# Patient Record
Sex: Female | Born: 1975 | Race: White | Hispanic: No | Marital: Married | Smoking: Never smoker
Health system: Southern US, Community
[De-identification: ages and names within clinical notes are randomized; demographics above are authoritative.]

## PROBLEM LIST (undated history)

## (undated) ENCOUNTER — Emergency Department (HOSPITAL_COMMUNITY): Discharge status: Against Medical Advice | Payer: BC Managed Care – PPO

## (undated) DIAGNOSIS — R5381 Other malaise: Secondary | ICD-10-CM

## (undated) DIAGNOSIS — B977 Papillomavirus as the cause of diseases classified elsewhere: Secondary | ICD-10-CM

## (undated) DIAGNOSIS — B2790 Infectious mononucleosis, unspecified without complication: Secondary | ICD-10-CM

## (undated) DIAGNOSIS — F509 Eating disorder, unspecified: Secondary | ICD-10-CM

## (undated) DIAGNOSIS — E785 Hyperlipidemia, unspecified: Secondary | ICD-10-CM

## (undated) DIAGNOSIS — F32A Depression, unspecified: Secondary | ICD-10-CM

## (undated) DIAGNOSIS — F329 Major depressive disorder, single episode, unspecified: Secondary | ICD-10-CM

## (undated) DIAGNOSIS — R5383 Other fatigue: Secondary | ICD-10-CM

## (undated) DIAGNOSIS — S83419A Sprain of medial collateral ligament of unspecified knee, initial encounter: Secondary | ICD-10-CM

## (undated) DIAGNOSIS — E039 Hypothyroidism, unspecified: Secondary | ICD-10-CM

## (undated) HISTORY — DX: Papillomavirus as the cause of diseases classified elsewhere: B97.7

## (undated) HISTORY — DX: Major depressive disorder, single episode, unspecified: F32.9

## (undated) HISTORY — DX: Eating disorder, unspecified: F50.9

## (undated) HISTORY — DX: Other fatigue: R53.81

## (undated) HISTORY — DX: Depression, unspecified: F32.A

## (undated) HISTORY — DX: Sprain of medial collateral ligament of unspecified knee, initial encounter: S83.419A

## (undated) HISTORY — DX: Hyperlipidemia, unspecified: E78.5

## (undated) HISTORY — DX: Other fatigue: R53.83

## (undated) HISTORY — DX: Hypothyroidism, unspecified: E03.9

## (undated) HISTORY — DX: Infectious mononucleosis, unspecified without complication: B27.90

---

## 1994-12-22 DIAGNOSIS — B2790 Infectious mononucleosis, unspecified without complication: Secondary | ICD-10-CM

## 1994-12-22 HISTORY — DX: Infectious mononucleosis, unspecified without complication: B27.90

## 1999-12-19 ENCOUNTER — Other Ambulatory Visit: Admission: RE | Admit: 1999-12-19 | Discharge: 1999-12-19 | Payer: Self-pay | Admitting: Family Medicine

## 2000-09-12 ENCOUNTER — Emergency Department (HOSPITAL_COMMUNITY): Admission: EM | Admit: 2000-09-12 | Discharge: 2000-09-12 | Payer: Self-pay | Admitting: Emergency Medicine

## 2000-09-12 ENCOUNTER — Encounter: Payer: Self-pay | Admitting: Emergency Medicine

## 2000-12-22 DIAGNOSIS — S83419A Sprain of medial collateral ligament of unspecified knee, initial encounter: Secondary | ICD-10-CM

## 2000-12-22 HISTORY — PX: KNEE SURGERY: SHX244

## 2000-12-22 HISTORY — DX: Sprain of medial collateral ligament of unspecified knee, initial encounter: S83.419A

## 2000-12-25 ENCOUNTER — Other Ambulatory Visit: Admission: RE | Admit: 2000-12-25 | Discharge: 2000-12-25 | Payer: Self-pay | Admitting: Family Medicine

## 2000-12-31 ENCOUNTER — Ambulatory Visit (HOSPITAL_BASED_OUTPATIENT_CLINIC_OR_DEPARTMENT_OTHER): Admission: RE | Admit: 2000-12-31 | Discharge: 2001-01-01 | Payer: Self-pay | Admitting: Orthopedic Surgery

## 2001-03-25 ENCOUNTER — Encounter: Payer: Self-pay | Admitting: Family Medicine

## 2001-03-25 ENCOUNTER — Other Ambulatory Visit: Admission: RE | Admit: 2001-03-25 | Discharge: 2001-03-25 | Payer: Self-pay | Admitting: Family Medicine

## 2001-03-25 LAB — CONVERTED CEMR LAB: Pap Smear: ABNORMAL

## 2001-04-19 ENCOUNTER — Other Ambulatory Visit: Admission: RE | Admit: 2001-04-19 | Discharge: 2001-04-19 | Payer: Self-pay | Admitting: *Deleted

## 2001-04-19 ENCOUNTER — Encounter (INDEPENDENT_AMBULATORY_CARE_PROVIDER_SITE_OTHER): Payer: Self-pay

## 2001-04-21 HISTORY — PX: COLPOSCOPY: SHX161

## 2003-09-22 HISTORY — PX: CHOLECYSTECTOMY: SHX55

## 2003-10-12 ENCOUNTER — Emergency Department (HOSPITAL_COMMUNITY): Admission: AD | Admit: 2003-10-12 | Discharge: 2003-10-12 | Payer: Self-pay | Admitting: Emergency Medicine

## 2003-10-12 ENCOUNTER — Encounter: Payer: Self-pay | Admitting: Emergency Medicine

## 2003-10-14 ENCOUNTER — Observation Stay (HOSPITAL_COMMUNITY): Admission: RE | Admit: 2003-10-14 | Discharge: 2003-10-15 | Payer: Self-pay

## 2003-10-14 ENCOUNTER — Encounter (INDEPENDENT_AMBULATORY_CARE_PROVIDER_SITE_OTHER): Payer: Self-pay | Admitting: Specialist

## 2005-03-20 ENCOUNTER — Ambulatory Visit: Payer: Self-pay | Admitting: Family Medicine

## 2005-12-01 ENCOUNTER — Ambulatory Visit: Payer: Self-pay | Admitting: Family Medicine

## 2005-12-24 ENCOUNTER — Inpatient Hospital Stay: Payer: Self-pay | Admitting: Obstetrics & Gynecology

## 2006-02-24 ENCOUNTER — Ambulatory Visit: Payer: Self-pay | Admitting: Family Medicine

## 2006-05-15 ENCOUNTER — Observation Stay: Payer: Self-pay | Admitting: Obstetrics and Gynecology

## 2006-06-25 ENCOUNTER — Observation Stay: Payer: Self-pay | Admitting: Obstetrics and Gynecology

## 2006-07-06 ENCOUNTER — Inpatient Hospital Stay: Payer: Self-pay | Admitting: Obstetrics & Gynecology

## 2007-07-14 ENCOUNTER — Encounter: Payer: Self-pay | Admitting: Family Medicine

## 2007-07-14 DIAGNOSIS — E039 Hypothyroidism, unspecified: Secondary | ICD-10-CM | POA: Insufficient documentation

## 2007-07-14 DIAGNOSIS — F509 Eating disorder, unspecified: Secondary | ICD-10-CM | POA: Insufficient documentation

## 2007-07-14 DIAGNOSIS — F329 Major depressive disorder, single episode, unspecified: Secondary | ICD-10-CM | POA: Insufficient documentation

## 2007-07-16 ENCOUNTER — Ambulatory Visit: Payer: Self-pay | Admitting: Family Medicine

## 2007-07-16 DIAGNOSIS — M25561 Pain in right knee: Secondary | ICD-10-CM

## 2007-07-16 DIAGNOSIS — G8929 Other chronic pain: Secondary | ICD-10-CM | POA: Insufficient documentation

## 2007-07-16 DIAGNOSIS — L259 Unspecified contact dermatitis, unspecified cause: Secondary | ICD-10-CM | POA: Insufficient documentation

## 2007-07-16 DIAGNOSIS — M25562 Pain in left knee: Secondary | ICD-10-CM

## 2007-07-16 DIAGNOSIS — R5383 Other fatigue: Secondary | ICD-10-CM

## 2007-07-16 DIAGNOSIS — R5381 Other malaise: Secondary | ICD-10-CM | POA: Insufficient documentation

## 2007-07-19 LAB — CONVERTED CEMR LAB
ALT: 16 units/L (ref 0–35)
AST: 13 units/L (ref 0–37)
Albumin: 3.7 g/dL (ref 3.5–5.2)
Alkaline Phosphatase: 64 units/L (ref 39–117)
BUN: 7 mg/dL (ref 6–23)
Basophils Absolute: 0.1 10*3/uL (ref 0.0–0.1)
Basophils Relative: 1.2 % — ABNORMAL HIGH (ref 0.0–1.0)
Bilirubin, Direct: 0.1 mg/dL (ref 0.0–0.3)
CO2: 28 meq/L (ref 19–32)
Calcium: 9.2 mg/dL (ref 8.4–10.5)
Chloride: 106 meq/L (ref 96–112)
Creatinine, Ser: 0.5 mg/dL (ref 0.4–1.2)
Eosinophils Absolute: 0.2 10*3/uL (ref 0.0–0.6)
Eosinophils Relative: 2 % (ref 0.0–5.0)
Free T4: 0.8 ng/dL (ref 0.6–1.6)
GFR calc Af Amer: 184 mL/min
GFR calc non Af Amer: 152 mL/min
Glucose, Bld: 78 mg/dL (ref 70–99)
HCT: 39.1 % (ref 36.0–46.0)
Hemoglobin: 13.6 g/dL (ref 12.0–15.0)
Lymphocytes Relative: 27 % (ref 12.0–46.0)
MCHC: 34.8 g/dL (ref 30.0–36.0)
MCV: 92.8 fL (ref 78.0–100.0)
Monocytes Absolute: 1 10*3/uL — ABNORMAL HIGH (ref 0.2–0.7)
Monocytes Relative: 13.4 % — ABNORMAL HIGH (ref 3.0–11.0)
Neutro Abs: 4.4 10*3/uL (ref 1.4–7.7)
Neutrophils Relative %: 56.4 % (ref 43.0–77.0)
Platelets: 323 10*3/uL (ref 150–400)
Potassium: 4.2 meq/L (ref 3.5–5.1)
RBC: 4.22 M/uL (ref 3.87–5.11)
RDW: 11.7 % (ref 11.5–14.6)
Sodium: 138 meq/L (ref 135–145)
T3 Uptake Ratio: 37.4 % — ABNORMAL HIGH (ref 22.5–37.0)
TSH: 1.1 microintl units/mL (ref 0.35–5.50)
Total Bilirubin: 0.8 mg/dL (ref 0.3–1.2)
Total Protein: 6.8 g/dL (ref 6.0–8.3)
WBC: 7.8 10*3/uL (ref 4.5–10.5)

## 2007-08-02 ENCOUNTER — Ambulatory Visit: Payer: Self-pay | Admitting: Family Medicine

## 2007-10-27 ENCOUNTER — Telehealth: Payer: Self-pay | Admitting: Family Medicine

## 2007-11-20 DIAGNOSIS — J029 Acute pharyngitis, unspecified: Secondary | ICD-10-CM | POA: Insufficient documentation

## 2007-11-22 ENCOUNTER — Ambulatory Visit: Payer: Self-pay | Admitting: Internal Medicine

## 2007-11-22 LAB — CONVERTED CEMR LAB: Rapid Strep: NEGATIVE

## 2009-01-04 ENCOUNTER — Ambulatory Visit: Payer: Self-pay | Admitting: Internal Medicine

## 2011-05-09 NOTE — Op Note (Signed)
[place]  Patient:    Teresa Horne, Teresa Horne                     MRN: [id number] Proc. Date: 12/31/00 Adm. Date:  [id number] Disc. Date: [id number] Attending:  Benny Lennert                           Operative Report  PREOPERATIVE DIAGNOSES:  Patellofemoral instability, left knee, with multiple recurrent patellofemoral dislocations, now with lateral tracking and tethering patellofemoral joint.  Resultant chondromalacia of the patella.  POSTOPERATIVE DIAGNOSES:  Patellofemoral instability, left knee, with multiple recurrent patellofemoral dislocations, now with lateral tracking and tethering patellofemoral joint.  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  PROCEDURES:  Left knee examination under anesthesia and arthroscopy, with chondroplasty of the patella.  Partial medial meniscectomy.  Arthroscopic lateral retinacular release.  Open medial reefing patellofemoral joint at medial retinaculum.  SURGEON:  Loreta Ave, M.D.  ASSISTANT:  Arlys John D. Petrarca, P.A.-C.  ANESTHESIA:  General.  ESTIMATED BLOOD LOSS:  Minimal.  TOURNIQUET TIME:  One hour.  SPECIMENS:  None.  CULTURES:  None.  COMPLICATIONS:  None.  DRESSINGS:  Soft compressive with knee immobilizer.  DESCRIPTION OF PROCEDURE:  Patient brought to the operating room and placed on the operating table in supine position.  After adequate anesthesia had been obtained, both knees examined.  Full motion, good stability to ligamentous testing, both knees.  Obvious patellofemoral instability, left knee, where the patella could be dislocated without much difficulty.  Lateral tethering, much more so on the left than on the right knee.  Tourniquet applied, prepped and draped usual sterile fashion.  Exsanguinated with elevation and Esmarch. Tourniquet inflated to 300 mmHg.  Two portals created, one each medial and lateral  parapatellar.  Inflow catheter introduced along with the arthroscope and the knee distended.  It was inspected.  A pattern of lateral tracking and lateral instability and lateral tethering confirmed with grade 2 and 3 chondral changes, most of this fissuring.  Debrided to a stable surface. Trochlea relatively shallow, but no chondral changes.  Small radial tear midportion lateral meniscus, saucerized out.  Remaining articular cartilage, meniscus, cruciate ligaments intact.  Thoroughly assessed patellofemoral tracking from all angles.  Lateral release performed with cautery from the vastus lateralis attachment superior down to the lateral joint line, which markedly improved tethering but still left her with marked laxity of the medial retinaculum.  Instruments and fluid removed.  Small curved incision along the medial patella.  Skin and subcutaneous tissue divided.  Superficial fascia was lifted off the top of the patella and over the medial retinaculum and VMO for later repair.  I then took down the vastus medialis and medial retinaculum from just above the patella to just below the patella and along the medial side.  This was well-captured with nonabsorbable #2 Ethibond, which was then utilized to reef the entire medial structures a centimeter up on top of the patella.  This was then oversewn with the superficial fascia.  Deep sutures nonabsorbable, superficial sutures were absorbable.  The knee was then assessed.  The knee was then assessed with marked improvement of medial stability; however, she tilted up too much on the lateral side with clinical assessment and with arthroscopy.  I therefore was able to bring the medial incision over toward the lateral side of the patella to assess our  lateral release.  Although done in the standard fashion, this did appear to extend up into the vastus lateralis tendon slightly.  As this allowed too much lateral elevation of the patella, I put in a  single nonabsorbable suture and one absorbable suture in the superior end of the release, which is the inferior part of the vastus lateralis tendon.  This was approximated anatomically.  The knee was then reassessed with excellent alignment and stability, with a good lateral release, which was now not excessive, and with good medial reefing.  I could bring it to more than 100 degrees of flexion without undue tension on the repair.  All wounds were copiously irrigated and closed with subcutaneous and subcuticular Vicryl.  Portals closed with nylon.  Margins of the wound injected with Marcaine, as was the knee.  Sterile compressive dressing applied.  Tourniquet deflated and removed.  Knee immobilizer applied. Anesthesia was then to proceed with a femoral nerve block.  Following this, anesthesia reversed, brought to recovery room.  Tolerated surgery well, no complications. DD:  12/31/00 TD:  12/31/00 Job: [id number]

## 2011-05-09 NOTE — Op Note (Signed)
NAME:  Teresa Horne, Teresa Horne NO.:  [id number]   MEDICAL RECORD NO.:  [id number]                   PATIENT TYPE:  OUT   LOCATION:  [facility]                                 FACILITY:  [facility]   PHYSICIAN:  Lorre Munroe., M.D.            DATE OF BIRTH:  Dec 13, 1976   DATE OF PROCEDURE:  10/14/2003  DATE OF DISCHARGE:                                 OPERATIVE REPORT   PREOPERATIVE DIAGNOSES:  Cholelithiasis and acute cholecystitis.   POSTOPERATIVE DIAGNOSES:  Cholecystitis and acute cholecystitis.   OPERATION:  Laparoscopic cholecystectomy.   SURGEON:  Lebron Conners, M.D.   ASSISTANT:  Currie Paris, M.D.   ANESTHESIA:  General.   DESCRIPTION OF PROCEDURE:  After the patient was monitored and anesthetized  and had routine preparation and draping of the abdomen, I infused local  anesthetic just below the umbilicus and subsequently at three additional  port sites. I made a short infraumbilical incision and dissected down  through the fat until I saw the midline fascia and I incised it in the  midline longitudinally for about a centimeter and a half and then entered  the peritoneum bluntly. I put a #0 Vicryl pursestring suture in the fascia  and secured a Hasson cannula with it and inflated the abdomen with CO2.  Inspection of the abdominal contents disclosed no abnormalities except for  inflammation and edema of the gallbladder. After placement of three  additional ports and positioning the patient head up foot down and tilted to  the left, I retracted the fundus of the gallbladder toward the right  shoulder, took down adhesions to the undersurface of the gallbladder and  retracted the infundibulum laterally. I dissected out the cystic duct and  cystic artery dissecting until I saw the cystic duct very clearly enlarging  as it emerged from the infundibulum of the gallbladder and then I clipped it  with four clips and cut between the two closest to the  gallbladder. I  clipped the cystic artery with three clips and cut between the two closest  to the gallbladder. I then used the cautery to dissect the gallbladder out  of the gallbladder fossa gaining hemostasis with the Bovie and with a couple  of other clips. Prior to detaching it, I very thoroughly cauterized the bed,  got good hemostasis and felt at the conclusion of the procedure that  hemostasis was excellent. I placed the gallbladder into a plastic pouch and  retracted it above the liver and then copiously irrigated the operative area  and removed the irrigant. I removed the gallbladder from the body through  the umbilical incision and tied the pursestring suture. Inspection again  revealed good hemostasis and that the clips were secure. Sponge, needle and  instrument counts were correct. I removed the lateral ports under direct  vision and then allowed the CO2 to escape and removed the epigastric port. I  closed all skin  incisions with intracuticular 4-0 Vicryl and Steri-Strips.  The patient tolerated the operation well.                                               Lorre Munroe., M.D.    Jodi Marble  D:  10/14/2003  T:  10/14/2003  Job:  [id number]

## 2011-09-29 ENCOUNTER — Encounter: Payer: Self-pay | Admitting: Family Medicine

## 2011-09-30 ENCOUNTER — Encounter: Payer: Self-pay | Admitting: Family Medicine

## 2011-09-30 ENCOUNTER — Ambulatory Visit (INDEPENDENT_AMBULATORY_CARE_PROVIDER_SITE_OTHER): Payer: BC Managed Care – PPO | Admitting: Family Medicine

## 2011-09-30 VITALS — BP 120/76 | HR 88 | Temp 98.5°F | Ht 62.0 in | Wt 153.0 lb

## 2011-09-30 DIAGNOSIS — E039 Hypothyroidism, unspecified: Secondary | ICD-10-CM

## 2011-09-30 DIAGNOSIS — Z0000 Encounter for general adult medical examination without abnormal findings: Secondary | ICD-10-CM

## 2011-09-30 DIAGNOSIS — Z23 Encounter for immunization: Secondary | ICD-10-CM

## 2011-09-30 DIAGNOSIS — Z1231 Encounter for screening mammogram for malignant neoplasm of breast: Secondary | ICD-10-CM

## 2011-09-30 NOTE — Assessment & Plan Note (Signed)
In past , not now  This is handled by her headache doc- Dr Park Breed in [place]  Last tsh nl and no meds -- also clinically stable Called for those labs

## 2011-09-30 NOTE — Assessment & Plan Note (Signed)
Reviewed health habits including diet and exercise and skin cancer prevention Also reviewed health mt list, fam hx and immunizations   Will return to gyn for gyn exam and pap - also because she needs her mirena changed as well - she will make her own appt We will sched baseline mam if covered  Will request wellness labs from Dr Park Breed and review those Tdap and flu shots today - disc imp of these  Urged to keep up healthy diet and exercise - and recommended consideration of wt watchers for tracking -- do not see further body image issues at play but know what to watch for

## 2011-09-30 NOTE — Assessment & Plan Note (Signed)
Interested in baseline mammogram if insurance covers it  Will schedule  No issues  Will return to gyn for gyn care and breast exam

## 2011-09-30 NOTE — Patient Instructions (Addendum)
Please call [facility] - Dr Welton Flakes to request last lab profile  Flu shot today Tdap today  We will schedule baseline mammogram at check out  Consider weight watchers to help weight maintenance  Keep healthy habits

## 2011-09-30 NOTE — Progress Notes (Signed)
Subjective:    Patient ID: Teresa Horne, female    DOB: 26-Oct-1976, 35 y.o.   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 -- last few paps normal  Last pap was few years ago - was going to [facility] --has mirena IUD since oct of 07 (this needs to be changed)  Colp in 02 Has not had a baseline mam  Tdap- last one was over 10 years ago  Flu shot- is afraid of that - but will get that   Hypothyroid- checked by her headache physician- was ok  No meds / at this point subclinical and not being treated No symptoms except wt gain    Had a lab profile - with her headache doctor - ?Dr Teresa Horne - neurology - [facility]  Headaches are mor frequent since having 2nd child  Also vary with the weather  Takes frova as needed   Patient Active Problem List  Diagnoses  . HYPOTHYROIDISM  . EATING DISORDER  . DEPRESSION  . Routine general medical examination at a health care facility  . Other screening mammogram   Past Medical History  Diagnosis Date  . Depression   . Hypothyroidism   . Eating disorder, unspecified   . Other malaise and fatigue   . Complete tear of MCL of knee 2002  . Mononucleosis 1996  . HPV in female    Past Surgical History  Procedure Date  . Knee surgery 2002    MCL rupture  . Colposcopy 5/02    HPV--no dysplasia  . Cholecystectomy 10/04   History  Substance Use Topics  . Smoking status:  Never Smoker   . Smokeless tobacco: Not on file  . Alcohol Use: Not on file   Family History  Problem Relation Age of Onset  . Other Mother     ? Cluster headaches   No Known Allergies Current Outpatient Prescriptions on File Prior to Visit  Medication Sig Dispense Refill  . levonorgestrel (MIRENA) 20 MCG/24HR IUD 1 each by Intrauterine route once. Has had since 2007            Review of Systems Review of Systems  Constitutional: Negative for fever, appetite change, fatigue and pos for wt gain.  Eyes: Negative for pain and visual disturbance.  Respiratory: Negative for cough and shortness of breath.   Cardiovascular: Negative for cp or palpitations    Gastrointestinal: Negative for nausea, diarrhea and constipation.  Genitourinary: Negative for urgency and frequency.  Skin: Negative for pallor or rash   Neurological: Negative for weakness, light-headedness, numbness and pos for occ headaches .  Hematological: Negative for adenopathy. Does not bruise/bleed easily.  Psychiatric/Behavioral: Negative for dysphoric mood. The patient is not nervous/anxious.  Objective:   Physical Exam  Constitutional: She appears well-developed and well-nourished. No distress.       Mildly overwt and well appearing  HENT:  Head: Normocephalic and atraumatic.  Right Ear: External ear normal.  Left Ear: External ear normal.  Nose: Nose normal.  Mouth/Throat: Oropharynx is clear and moist.  Eyes: Conjunctivae and EOM are normal. Pupils are equal, round, and reactive to light.  Neck: Normal range of motion. Neck supple. No JVD present. Carotid bruit is not present. No thyromegaly present.  Cardiovascular: Normal rate, regular rhythm, normal heart sounds and intact distal pulses.  Exam reveals no gallop.   Pulmonary/Chest: Effort normal and breath sounds normal. No respiratory distress. She has no wheezes.  Abdominal: Soft. Bowel sounds are normal. She exhibits no distension and no mass.  There is no tenderness.       No renal bruits   Musculoskeletal: Normal range of motion. She exhibits no edema and no tenderness.  Lymphadenopathy:    She has no cervical adenopathy.  Neurological: She is alert. She has normal reflexes. No cranial nerve deficit. Coordination normal.  Skin: Skin is warm and dry. No rash noted. No erythema. No pallor.       Few lentigos   Psychiatric: She has a normal mood and affect.          Assessment & Plan:

## 2011-12-17 ENCOUNTER — Encounter: Payer: Self-pay | Admitting: Family Medicine

## 2013-07-11 ENCOUNTER — Telehealth: Payer: Self-pay | Admitting: Family Medicine

## 2013-07-11 NOTE — Telephone Encounter (Signed)
Attempted to reach patient x 2 attempts for urinary symptoms.  On callback, unable to reach patient at number given; messages left on identified voicemail to contact office for assistance.  krs/can

## 2013-07-12 ENCOUNTER — Telehealth: Payer: Self-pay | Admitting: Family Medicine

## 2013-07-12 ENCOUNTER — Ambulatory Visit: Payer: BC Managed Care – PPO | Admitting: Family Medicine

## 2013-07-12 NOTE — Telephone Encounter (Signed)
[facility] Triage Call Report Triage Record Num: [id number] Operator: Geanie Berlin Patient Name: Teresa Horne Call Date & Time: 07/11/2013 5:14:09PM Patient Phone: [phone] PCP: Idamae Schuller A. Tower Patient Gender: Female PCP Fax : Patient DOB: 05/28/1976 Practice Name: [facility] Reason for Call: Caller: Adelayde/Patient; PCP: Roxy Manns [facility]); CB#: [phone]; Call regarding Urinary Pain; Onset: 07/11/13. Afebrile. LMP: unknonw/Mirena IUD. Increasing dysuria, urgency and frequency. Uncertian if she can wait to see MD until 07/12/13. Instructed to use urinary analgesic and Ibuprofen as directed on the label. > water intake to dilute urine. Advised to see MD within 24 hours for one or more UTI symptoms not previously evaluated per Urinary Symptoms guideline. Office staff already scheduled for 0815 07/12/13 with Dr Milinda Antis. If symptoms worsen, may go to UC tonight; educated that antibiotic started tonight will not reduce symptoms for at least 24-48 hours. Protocol(s) Used: Urinary Symptoms - Female Recommended Outcome per Protocol: See Provider within 24 hours Reason for Outcome: Has one or more urinary tract symptoms AND has not been previously evaluated Care Advice: ~ Call provider if you develop flank or low back pain, fever, generally feel sick. ~ SYMPTOM / CONDITION MANAGEMENT ~ CAUTIONS Limit carbonated, alcoholic, and caffeinated beverages such as coffee, tea and soda. Avoid nonprescription cold and allergy medications that contain caffeine. Limit intake of tomatoes, fruit juices (except for unsweetened cranberry juice), dairy products, spicy foods, sugar, and artificial sweeteners (aspartame or saccharine). Stop or decrease smoking. Reducing exposure to bladder irritants may help lessen urgency. ~ ~ Call provider if urine is pink, red, smoky or cola colored. Systemic Inflammatory Response Syndrome (SIRS): Watch for signs of a generalized, whole body  infection. Occurs within days of a localized infection, especially of the urinary, GI, respiratory or nervous systems; or after a traumatic injury or invasive procedure. - Call EMS 911 if symptoms have worsened, such as increasing confusion or unusual drowsiness; cold and clammy skin; no urine output; rapid respiration (>30/min.) or slow respiration (<10/min.); struggling to breathe. - Go to the ED immediately for early symptoms of rapid pulse >90/min. or rapid breathing >20/min. at rest; chills; oral temperature >100.4 F (38 C) or <96.8 F (36 C) when associated with conditions noted. ~ Analgesic/Antipyretic Advice - NSAIDs: Consider aspirin, ibuprofen, naproxen or ketoprofen for pain or fever as directed on label or by pharmacist/provider. PRECAUTIONS: - If over 33 years of age, should not take longer than 1 week without consulting provider. EXCEPTIONS: - Should not be used if taking blood thinners or have bleeding problems. - Do not use if have history of sensitivity/allergy to any of these medications; or history of cardiovascular, ulcer, kidney, liver disease or diabetes unless approved by provider. - Do not exceed recommended dose or frequency. ~ Increase intake of fluids. Try to drink 8 oz. (.2 liter) every hour when awake, unless on restricted fluids for other medical reasons. Include at least two 8 oz. (.2 liter) glasses of unsweetened cranberry juice each day. Take sips of fluid or eat ice chips if nauseated or vomiting. ~ 07/11/2013 5:28:50PM Page 1 of 1 [id number]

## 2013-07-12 NOTE — Telephone Encounter (Signed)
Pt went to UC yesterday that's why she cancelled her appt

## 2013-07-12 NOTE — Telephone Encounter (Signed)
She must have cancelled her appt this am-please check in with her

## 2014-08-09 ENCOUNTER — Encounter: Payer: Self-pay | Admitting: Family Medicine

## 2014-08-09 ENCOUNTER — Ambulatory Visit (INDEPENDENT_AMBULATORY_CARE_PROVIDER_SITE_OTHER)
Admission: RE | Admit: 2014-08-09 | Discharge: 2014-08-09 | Disposition: A | Discharge status: Home / Self Care | Payer: BC Managed Care – PPO | Source: Ambulatory Visit | Attending: Family Medicine | Admitting: Family Medicine

## 2014-08-09 ENCOUNTER — Ambulatory Visit (INDEPENDENT_AMBULATORY_CARE_PROVIDER_SITE_OTHER): Payer: BC Managed Care – PPO | Admitting: Family Medicine

## 2014-08-09 ENCOUNTER — Encounter (INDEPENDENT_AMBULATORY_CARE_PROVIDER_SITE_OTHER): Payer: Self-pay

## 2014-08-09 VITALS — BP 110/74 | HR 100 | Temp 98.2°F | Ht 62.0 in | Wt 144.2 lb

## 2014-08-09 DIAGNOSIS — R0789 Other chest pain: Secondary | ICD-10-CM

## 2014-08-09 DIAGNOSIS — R071 Chest pain on breathing: Secondary | ICD-10-CM

## 2014-08-09 DIAGNOSIS — M25569 Pain in unspecified knee: Secondary | ICD-10-CM

## 2014-08-09 DIAGNOSIS — M25561 Pain in right knee: Secondary | ICD-10-CM | POA: Insufficient documentation

## 2014-08-09 MED ORDER — CYCLOBENZAPRINE HCL 5 MG PO TABS: 5.0000 mg | ORAL_TABLET | Freq: Three times a day (TID) | ORAL | Status: DC | PRN

## 2014-08-09 MED ORDER — MELOXICAM 15 MG PO TABS: 15.0000 mg | ORAL_TABLET | Freq: Every day | ORAL | Status: DC

## 2014-08-09 NOTE — Patient Instructions (Signed)
Heat on chest  Hold a pillow and take deep breaths every 1/2 hour Ice on knee and elevate when you can Flexeril as needed with caution meloxicam instead of ibuprofen or toradol- take with food for pain xrays now  Will contact you with results

## 2014-08-09 NOTE — Assessment & Plan Note (Signed)
Xray ribs and chest  No cough or sob  Pleuritic cp  Refill flexeril Trial of mobid  Update with results  Disc avoidance of atelectasis

## 2014-08-09 NOTE — Assessment & Plan Note (Signed)
Suspect medial hematoma  Reassuring exam-nl rom  Xray today  mobic prn  Ice and elevation
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
## 2014-08-09 NOTE — Progress Notes (Signed)
Pre visit review using our clinic review tool, if applicable. No additional management support is needed unless otherwise documented below in the visit note. 

## 2015-01-10 ENCOUNTER — Encounter: Payer: Self-pay | Admitting: Family Medicine

## 2015-01-10 ENCOUNTER — Ambulatory Visit (INDEPENDENT_AMBULATORY_CARE_PROVIDER_SITE_OTHER): Payer: BLUE CROSS/BLUE SHIELD | Admitting: Family Medicine

## 2015-01-10 VITALS — BP 116/80 | HR 79 | Temp 97.8°F | Ht 62.0 in | Wt 148.1 lb

## 2015-01-10 DIAGNOSIS — M26609 Unspecified temporomandibular joint disorder, unspecified side: Secondary | ICD-10-CM | POA: Insufficient documentation

## 2015-01-10 DIAGNOSIS — M2660 Temporomandibular joint disorder, unspecified: Secondary | ICD-10-CM

## 2015-01-10 NOTE — Patient Instructions (Signed)
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

## 2015-01-10 NOTE — Progress Notes (Signed)
Pre visit review using our clinic review tool, if applicable. No additional management support is needed unless otherwise documented below in the visit note. 

## 2015-01-10 NOTE — Progress Notes (Signed)
Subjective:    Patient ID: Teresa Horne, female    DOB: May 21, 1976, 39 y.o.   MRN: [id number]  HPI Here with poss sinus infection   Woke up Monday am and her jaw hurt on the R side  Ibuprofen otc helped  Then her throat became sore to swallow  Now area under jaw on R is tender to the touch   In area of sinuses - no pain  However the pressure she feels over her jaw feels similar   She did lie on a heating pad   No rash   If she yawns or takes a deep breath - is uncomfortable   No fever  No nasal symptoms  No cough or sneezing  Is tired   She does not grind her teeth that she knows of  Does not clench her jaw  No more stress than usual   Patient Active Problem List   Diagnosis Date Noted  . Right knee pain 08/09/2014  . Anterior chest wall pain 08/09/2014  . Routine general medical examination at a health care facility 09/30/2011  . Other screening mammogram 09/30/2011  . HYPOTHYROIDISM 07/14/2007  . EATING DISORDER 07/14/2007  . DEPRESSION 07/14/2007   Past Medical History  Diagnosis Date  . Depression   . Hypothyroidism   . Eating disorder, unspecified   . Other malaise and fatigue   . Complete tear of MCL of knee 2002  . Mononucleosis 1996  . HPV in female    Past Surgical History  Procedure Laterality Date  . Knee surgery  2002    MCL rupture  . Colposcopy  5/02    HPV--no dysplasia  . Cholecystectomy  10/04   History  Substance Use Topics  . Smoking status: Never Smoker   . Smokeless tobacco: Not on file  . Alcohol Use: Yes     Comment: rare   Family History  Problem Relation Age of Onset  . Other Mother     ? Cluster headaches   No Known Allergies Current Outpatient Prescriptions on File Prior to Visit  Medication Sig Dispense Refill  . levonorgestrel (MIRENA) 20 MCG/24HR IUD 1 each by Intrauterine route once. Has had since 2007      No current facility-administered medications on file prior to visit.     Review of  Systems Review of Systems  Constitutional: Negative for fever, appetite change, fatigue and unexpected weight change.  Eyes: Negative for pain and visual disturbance.  ENT pos for jaw/ear pain, neg for cong or rhinorrhea  Respiratory: Negative for cough and shortness of breath.   Cardiovascular: Negative for cp or palpitations    Gastrointestinal: Negative for nausea, diarrhea and constipation.  Genitourinary: Negative for urgency and frequency.  Skin: Negative for pallor or rash   Neurological: Negative for weakness, light-headedness, numbness and headaches.  Hematological: Negative for adenopathy. Does not bruise/bleed easily.  Psychiatric/Behavioral: Negative for dysphoric mood. The patient is not nervous/anxious.         Objective:   Physical Exam  Constitutional: She appears well-developed and well-nourished. No distress.  HENT:  Head: Normocephalic and atraumatic.  Right Ear: External ear normal.  Left Ear: External ear normal.  Nose: Nose normal.  Mouth/Throat: Oropharynx is clear and moist. No oropharyngeal exudate.  Tender over L TM joint with popping    Neck: Normal range of motion. Neck supple.  Cardiovascular: Normal rate and regular rhythm.   Pulmonary/Chest: Effort normal and breath sounds normal. No respiratory distress. She has  no wheezes. She has no rales.  Lymphadenopathy:    She has no cervical adenopathy.  Neurological: She is alert.  Skin: Skin is warm and dry. No rash noted.  Psychiatric: She has a normal mood and affect.          Assessment & Plan:   Problem List Items Addressed This Visit      Musculoskeletal and Integument   TMJ (temporomandibular joint disorder) - Primary    With tenderness and clicking on exam/ no dislocation  Disc symptomatic care - see instructions on AVS incl nsaid/heat/ice and jaw rest Hand out given  Will see dentist and update if no imp

## 2015-01-10 NOTE — Assessment & Plan Note (Signed)
With tenderness and clicking on exam/ no dislocation  Disc symptomatic care - see instructions on AVS incl nsaid/heat/ice and jaw rest Hand out given  Will see dentist and update if no imp

## 2015-01-26 ENCOUNTER — Telehealth: Payer: Self-pay | Admitting: Family Medicine

## 2015-01-26 NOTE — Telephone Encounter (Signed)
Patient returned your call. Please call patient back at  [phone].

## 2015-01-26 NOTE — Telephone Encounter (Signed)
It mush have been a while ago because I do not have it in her chart  What on her face were we treating? What dx?

## 2015-01-26 NOTE — Telephone Encounter (Signed)
Pt wanted to get a topical cream for her face that dr tower prescribed several years. (4-5 years) She couldn't remember name of it. Pt stated Rena wanted you to look back in her chart to see what rx was prescribed Pt is going [facility] to see if she could get the name of it

## 2015-01-26 NOTE — Telephone Encounter (Signed)
Left patient voicemail for call back.

## 2015-01-26 NOTE — Telephone Encounter (Signed)
Spoken to patient. She want to call Monday and make appt to discuss with Dr Glori Bickers the topical cream.

## 2015-01-29 ENCOUNTER — Ambulatory Visit (INDEPENDENT_AMBULATORY_CARE_PROVIDER_SITE_OTHER): Payer: BLUE CROSS/BLUE SHIELD | Admitting: Family Medicine

## 2015-01-29 ENCOUNTER — Encounter: Payer: Self-pay | Admitting: Family Medicine

## 2015-01-29 VITALS — BP 118/80 | HR 77 | Temp 97.8°F | Ht 62.0 in | Wt 150.1 lb

## 2015-01-29 DIAGNOSIS — R21 Rash and other nonspecific skin eruption: Secondary | ICD-10-CM

## 2015-01-29 MED ORDER — CLOTRIMAZOLE-BETAMETHASONE 1-0.05 % EX CREA: 1.0000 "application " | TOPICAL_CREAM | Freq: Two times a day (BID) | CUTANEOUS | Status: DC

## 2015-01-29 NOTE — Progress Notes (Signed)
Pre visit review using our clinic review tool, if applicable. No additional management support is needed unless otherwise documented below in the visit note. 

## 2015-01-29 NOTE — Assessment & Plan Note (Signed)
Small area under R nare (does not resemble impetigo) Brighter red area on R wrist - with central clearing (resembles tinea)  Will tx both areas with lotrisone cream Enc to keep them clean and dry  Update if worse or not imp in the next 1-2 wk

## 2015-01-29 NOTE — Progress Notes (Signed)
   Subjective:    Patient ID: Teresa Horne, female    DOB: 31-Dec-1975, 39 y.o.   MRN: [id number]  HPI Here with a rash   Tends to start on her face (has had it before)- had px for it   (? Steroid cream)  Under R nostril - redness and flaking and dryness  Also an area on R wrist  Red area  Is irritated but not too itchy   No impetigo at home  No one else has a rash   Patient Active Problem List   Diagnosis Date Noted  . TMJ (temporomandibular joint disorder) 01/10/2015  . Right knee pain 08/09/2014  . Anterior chest wall pain 08/09/2014  . Routine general medical examination at a health care facility 09/30/2011  . Other screening mammogram 09/30/2011  . HYPOTHYROIDISM 07/14/2007  . EATING DISORDER 07/14/2007  . DEPRESSION 07/14/2007   Past Medical History  Diagnosis Date  . Depression   . Hypothyroidism   . Eating disorder, unspecified   . Other malaise and fatigue   . Complete tear of MCL of knee 2002  . Mononucleosis 1996  . HPV in female    Past Surgical History  Procedure Laterality Date  . Knee surgery  2002    MCL rupture  . Colposcopy  5/02    HPV--no dysplasia  . Cholecystectomy  10/04   History  Substance Use Topics  . Smoking status: Never Smoker   . Smokeless tobacco: Not on file  . Alcohol Use: Yes     Comment: rare   Family History  Problem Relation Age of Onset  . Other Mother     ? Cluster headaches   No Known Allergies Current Outpatient Prescriptions on File Prior to Visit  Medication Sig Dispense Refill  . levonorgestrel (MIRENA) 20 MCG/24HR IUD 1 each by Intrauterine route once. Has had since 2007      No current facility-administered medications on file prior to visit.     Review of Systems     Objective:   Physical Exam  Constitutional: She appears well-developed and well-nourished. No distress.  overwt and well app  HENT:  Head: Normocephalic and atraumatic.  Mouth/Throat: Oropharynx is clear and moist.  Eyes:  Conjunctivae and EOM are normal. Pupils are equal, round, and reactive to light. Right eye exhibits no discharge. Left eye exhibits no discharge. No scleral icterus.  Neck: Normal range of motion. Neck supple.  Cardiovascular: Normal rate and regular rhythm.   Lymphadenopathy:    She has no cervical adenopathy.  Neurological: She is alert.  Skin: Skin is warm and dry. Rash noted. There is erythema.  1 cm area of pink scale (without crust) under R nare  2 by 3 cm area of bright erythema and papules with central clearing (oval) on R wrist   Psychiatric: She has a normal mood and affect.          Assessment & Plan:   Problem List Items Addressed This Visit      Musculoskeletal and Integument   Rash and nonspecific skin eruption - Primary    Small area under R nare (does not resemble impetigo) Brighter red area on R wrist - with central clearing (resembles tinea)  Will tx both areas with lotrisone cream Enc to keep them clean and dry  Update if worse or not imp in the next 1-2 wk

## 2015-01-29 NOTE — Patient Instructions (Signed)
Try lotrisone cream as directed on rash areas  If worse or no improvement please let me know   Keep areas clean and dry

## 2015-04-02 ENCOUNTER — Telehealth: Payer: Self-pay | Admitting: Family Medicine

## 2015-04-02 MED ORDER — OSELTAMIVIR PHOSPHATE 75 MG PO CAPS: 75.0000 mg | ORAL_CAPSULE | Freq: Every day | ORAL | Status: DC

## 2015-04-02 NOTE — Telephone Encounter (Signed)
Ailene called stating her spouse Teresa Horne was diagnosed with the flu and she wanted to know if she could get tama flu for herself [facility]

## 2015-04-02 NOTE — Telephone Encounter (Signed)
Patient informed tamiflu was sent to pharmacy.

## 2015-04-02 NOTE — Telephone Encounter (Signed)
Will send electronically.
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
## 2016-01-09 ENCOUNTER — Ambulatory Visit (INDEPENDENT_AMBULATORY_CARE_PROVIDER_SITE_OTHER): Payer: BLUE CROSS/BLUE SHIELD | Admitting: Obstetrics and Gynecology

## 2016-01-09 ENCOUNTER — Encounter: Payer: Self-pay | Admitting: Obstetrics and Gynecology

## 2016-01-09 ENCOUNTER — Ambulatory Visit (INDEPENDENT_AMBULATORY_CARE_PROVIDER_SITE_OTHER): Payer: BLUE CROSS/BLUE SHIELD

## 2016-01-09 VITALS — BP 112/82 | HR 98 | Ht 62.0 in | Wt 149.1 lb

## 2016-01-09 DIAGNOSIS — Z30433 Encounter for removal and reinsertion of intrauterine contraceptive device: Secondary | ICD-10-CM

## 2016-01-09 DIAGNOSIS — Z7189 Other specified counseling: Secondary | ICD-10-CM

## 2016-01-09 DIAGNOSIS — Z7689 Persons encountering health services in other specified circumstances: Secondary | ICD-10-CM

## 2016-01-09 NOTE — Progress Notes (Signed)
Teresa Horne is a 40 y.o. year old G25P0 Caucasian female who presents for removal and replacement of a Mirena IUD. She was given informed consent for removal and reinsertion of her Mirena. Her Mirena was placed 2007, No LMP recorded. Patient is not currently having periods (Reason: IUD)., and her pregnancy test today was negative.   The risks and benefits of the method and placement have been thouroughly reviewed with the patient and all questions were answered.  Specifically the patient is aware of failure rate of 12/998, expulsion of the IUD and of possible perforation.  The patient is aware of irregular bleeding due to the method and understands the incidence of irregular bleeding diminishes with time.  Signed copy of informed consent in chart.   No LMP recorded. Patient is not currently having periods (Reason: IUD). BP 112/82 mmHg  Pulse 98  Ht 5\' 2"  (1.575 m)  Wt 149 lb 1.6 oz (67.631 kg)  BMI 27.26 kg/m2 No results found for this or any previous visit (from the past 24 hour(s)).   Appropriate time out taken. A Graves speculum was placed in the vagina.  The cervix was visualized, prepped using Betadine. The strings are not visible. Ultrasound was performed to verify IUD and placement- then re-attempted removal- was able to grasp strings just inside of cervix and the Mirena was easily removed. The cervix was then grasped with a single-tooth tenaculum. The uterus was found to be neutral and it sounded to 7 cm.  Mirena IUD placed per manufacturer's recommendations without complications. The strings were trimmed to 3 cm.  The patient tolerated the procedure well.    Ultrasound Findings:  The uterus measures 7.9 x 4.0 x 4.9 cm. Echo texture is homogenous without evidence of focal masses. The IUD is seen within the uterus and on 3D imaging appears to be in the proper position within the uterus. The string appears to be short and near the lower uterine segment.  The Endometrium measures 2.2 mm  and appears WNL.  Right Ovary measures 3.1 x 2.2 x 2.3 cm. Normal appearing dominant follicles are seen. Left Ovary measures 3.0 x 2.1 x 2.1 cm. Normal appearing dominant follicles are seen.  Survey of the adnexa demonstrates no adnexal masses. There is no free fluid in the cul de sac.  Impression: 1. Normal appearing uterus and ovaries.  2. An IUD is seen and appears to be in normal position with 3D imaging. The string appears shortened and near the LUS/Cervical interface.   The patient was given post procedure instructions, including signs and symptoms of infection and to check for the strings after each menses or each month, and refraining from intercourse or anything in the vagina for 3 days.  She was given a Mirena care card with date Mirena placed, and date Mirena to be removed.    Teresa Horne, CNM

## 2016-02-20 ENCOUNTER — Encounter: Payer: Self-pay | Admitting: Obstetrics and Gynecology

## 2016-02-20 ENCOUNTER — Ambulatory Visit (INDEPENDENT_AMBULATORY_CARE_PROVIDER_SITE_OTHER): Payer: BLUE CROSS/BLUE SHIELD | Admitting: Obstetrics and Gynecology

## 2016-02-20 VITALS — BP 128/78 | HR 98 | Wt 151.2 lb

## 2016-02-20 DIAGNOSIS — Z30431 Encounter for routine checking of intrauterine contraceptive device: Secondary | ICD-10-CM | POA: Diagnosis not present

## 2016-02-20 NOTE — Progress Notes (Signed)
Subjective:     Patient ID: Teresa Horne, female   DOB: Oct 21, 1976, 40 y.o.   MRN: [id number]  HPI IUD replaced 6 weeks ago  Review of Systems Reports spotting x 3 days about 2 weeks ago, but no pain after insertion cramping stopped. Happy with IUD.    Objective:   Physical Exam A&O x4 Well groomed female Blood pressure 128/78, pulse 98, weight 151 lb 3.2 oz (68.584 kg). Pelvic exam: normal external genitalia, vulva, vagina, cervix, uterus and adnexa, IUD strings noted.    Assessment:     IUD check up     Plan:     To continue with IUD RTC as needed.  Melody Glencoe, CNM

## 2016-11-19 ENCOUNTER — Encounter: Payer: Self-pay | Admitting: Family Medicine

## 2016-11-19 ENCOUNTER — Ambulatory Visit (INDEPENDENT_AMBULATORY_CARE_PROVIDER_SITE_OTHER): Payer: BLUE CROSS/BLUE SHIELD

## 2016-11-19 ENCOUNTER — Ambulatory Visit (INDEPENDENT_AMBULATORY_CARE_PROVIDER_SITE_OTHER): Payer: BLUE CROSS/BLUE SHIELD | Admitting: Family Medicine

## 2016-11-19 VITALS — BP 118/80 | HR 103 | Temp 98.2°F | Resp 16 | Wt 159.1 lb

## 2016-11-19 DIAGNOSIS — J988 Other specified respiratory disorders: Secondary | ICD-10-CM

## 2016-11-19 DIAGNOSIS — R05 Cough: Secondary | ICD-10-CM | POA: Diagnosis not present

## 2016-11-19 LAB — POCT INFLUENZA A/B
Influenza A, POC: NEGATIVE
Influenza B, POC: NEGATIVE

## 2016-11-19 MED ORDER — DOXYCYCLINE HYCLATE 100 MG PO TABS: 100.0000 mg | ORAL_TABLET | Freq: Two times a day (BID) | ORAL | 0 refills | Status: DC

## 2016-11-19 NOTE — Assessment & Plan Note (Signed)
New problem. Given persistent fever and symptoms, obtaining chest x-ray. Treating empirically with doxycycline.

## 2016-11-19 NOTE — Progress Notes (Signed)
   Subjective:  Patient ID: Teresa Horne, female    DOB: [birth year]  Age: 40 y.o. MRN: [id number]  CC: Not feeling well  HPI:  40 year old female presents for an acute visit.  Patient states that she's not been feeling well and Friday. Symptoms are severe. She states that she's had intermittent fever, Tmax 102. She's also been experiencing cough, sore throat, congestion. She has difficulty sleeping also. She's been taking over-the-counter medications without symptom improvement. No known exacerbating factors. No other complaints at this time.  Social Hx   Social History   Social History  . Marital status: Married    Spouse name: N/A  . Number of children: N/A  . Years of education: N/A   Social History Main Topics  . Smoking status: Never Smoker  . Smokeless tobacco: None  . Alcohol use Yes     Comment: rare  . Drug use: No  . Sexual activity: Yes    Birth control/ protection: IUD     Comment: mirena   Other Topics Concern  . None   Social History Narrative   Married;1 child    Review of Systems  Constitutional: Positive for fever.  HENT: Positive for congestion, ear pain, sinus pressure and sore throat.   Respiratory: Positive for cough.    Objective:  BP 118/80 (BP Location: Left Arm, Patient Position: Sitting, Cuff Size: Normal)   Pulse (!) 103   Temp 98.2 F (36.8 C) (Oral)   Resp 16   Wt 159 lb 2 oz (72.2 kg)   SpO2 100%   BMI 29.10 kg/m   BP/Weight 11/19/2016 02/20/2016 [id number]  Systolic BP 123456 [id number]  Diastolic BP 80 78 82  Wt. (Lbs) 159.13 151.2 149.1  BMI 29.1 27.65 27.26   Physical Exam  Constitutional: She is oriented to person, place, and time.  Appears fatigued but in no acute distress.  HENT:  Head: Normocephalic and atraumatic.  Oropharynx with moderate erythema. No exudate. Normal TMs bilaterally. Mild maxillary sinus tenderness to palpation.  Cardiovascular: Regular rhythm.  Tachycardia present.   Pulmonary/Chest: Effort normal  and breath sounds normal.  Neurological: She is alert and oriented to person, place, and time.  Psychiatric: She has a normal mood and affect.  Vitals reviewed.  Lab Results  Component Value Date   WBC 7.8 07/16/2007   HGB 13.6 07/16/2007   HCT 39.1 07/16/2007   PLT 323 07/16/2007   GLUCOSE 78 07/16/2007   ALT 16 07/16/2007   AST 13 07/16/2007   NA 138 07/16/2007   K 4.2 07/16/2007   CL 106 07/16/2007   CREATININE 0.5 07/16/2007   BUN 7 07/16/2007   CO2 28 07/16/2007   TSH 1.10 07/16/2007   Assessment & Plan:   Problem List Items Addressed This Visit    Respiratory infection - Primary    New problem. Given persistent fever and symptoms, obtaining chest x-ray. Treating empirically with doxycycline.      Relevant Orders   POCT Influenza A/B   DG Chest 2 View     Meds ordered this encounter  Medications  . doxycycline (VIBRA-TABS) 100 MG tablet    Sig: Take 1 tablet (100 mg total) by mouth 2 (two) times daily.    Dispense:  20 tablet    Refill:  0    Follow-up: PRN  [place]

## 2016-11-19 NOTE — Patient Instructions (Signed)
Take the antibiotic as prescribed. ° °Take care ° °Dr. Annayah Worthley  °

## 2016-11-20 ENCOUNTER — Telehealth: Payer: Self-pay | Admitting: *Deleted

## 2016-11-20 ENCOUNTER — Other Ambulatory Visit: Payer: Self-pay | Admitting: Family Medicine

## 2016-11-20 MED ORDER — HYDROCOD POLST-CPM POLST ER 10-8 MG/5ML PO SUER: 5.0000 mL | Freq: Two times a day (BID) | ORAL | 0 refills | Status: DC | PRN

## 2016-11-20 NOTE — Telephone Encounter (Signed)
Recommendations?

## 2016-11-20 NOTE — Telephone Encounter (Signed)
Pt was seen in this office by Dr. Lacinda Axon on 11/29, pt was prescribed a antibiotic and now requested to have a cough medicine called into her pharmacy. Pt stated that the OTC medicine for her cough did not help her  Pt contact [facility]

## 2016-11-20 NOTE — Telephone Encounter (Signed)
Left voicemail stating rx was ready for pick up.

## 2016-11-20 NOTE — Telephone Encounter (Signed)
Patient can pick up Rx.

## 2016-11-25 DIAGNOSIS — H52223 Regular astigmatism, bilateral: Secondary | ICD-10-CM | POA: Diagnosis not present

## 2016-11-25 DIAGNOSIS — H5213 Myopia, bilateral: Secondary | ICD-10-CM | POA: Diagnosis not present

## 2018-01-09 IMAGING — DX DG CHEST 2V
2 series · 2 of 2 positions shown · non-contrast
Comparison: 08/09/2014

CLINICAL DATA: Cough, respiratory infection

EXAM:
CHEST  2 VIEW

[chest pa]
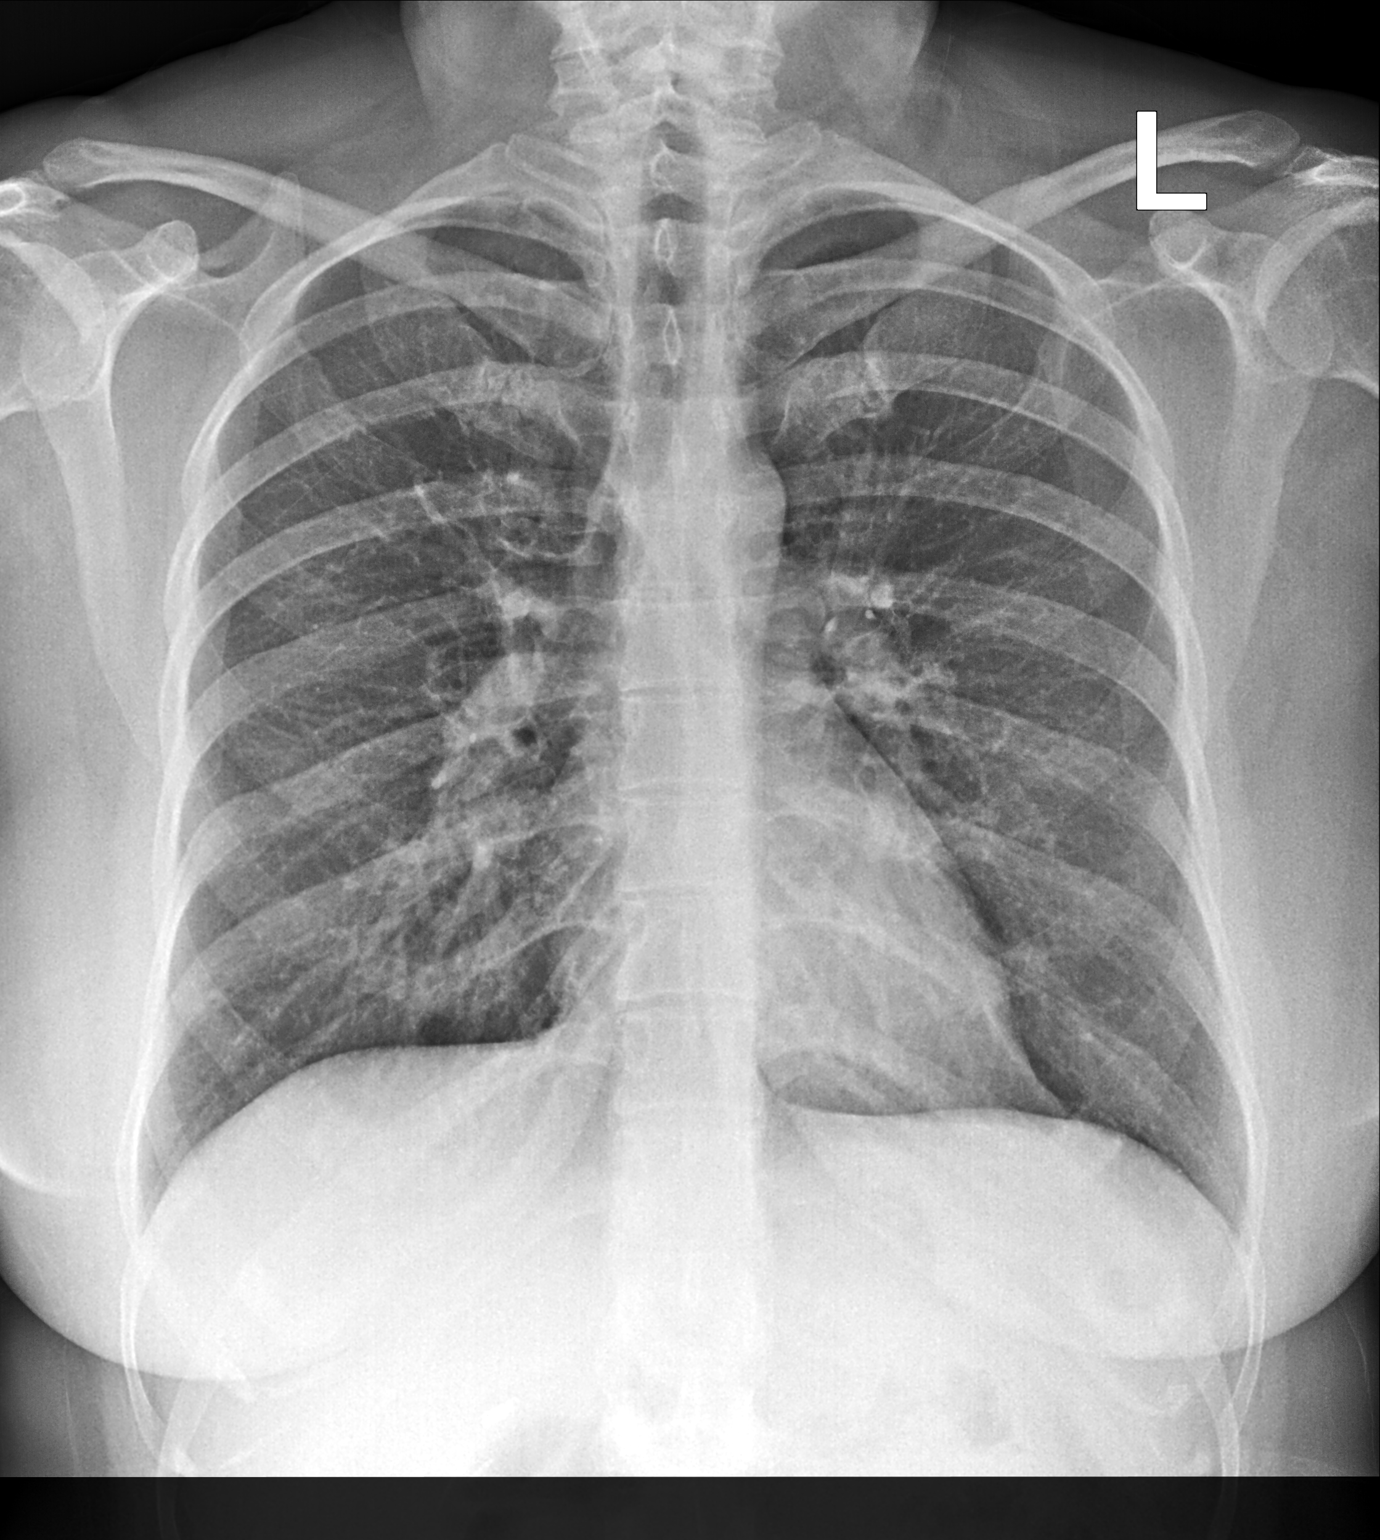

[chest lat]
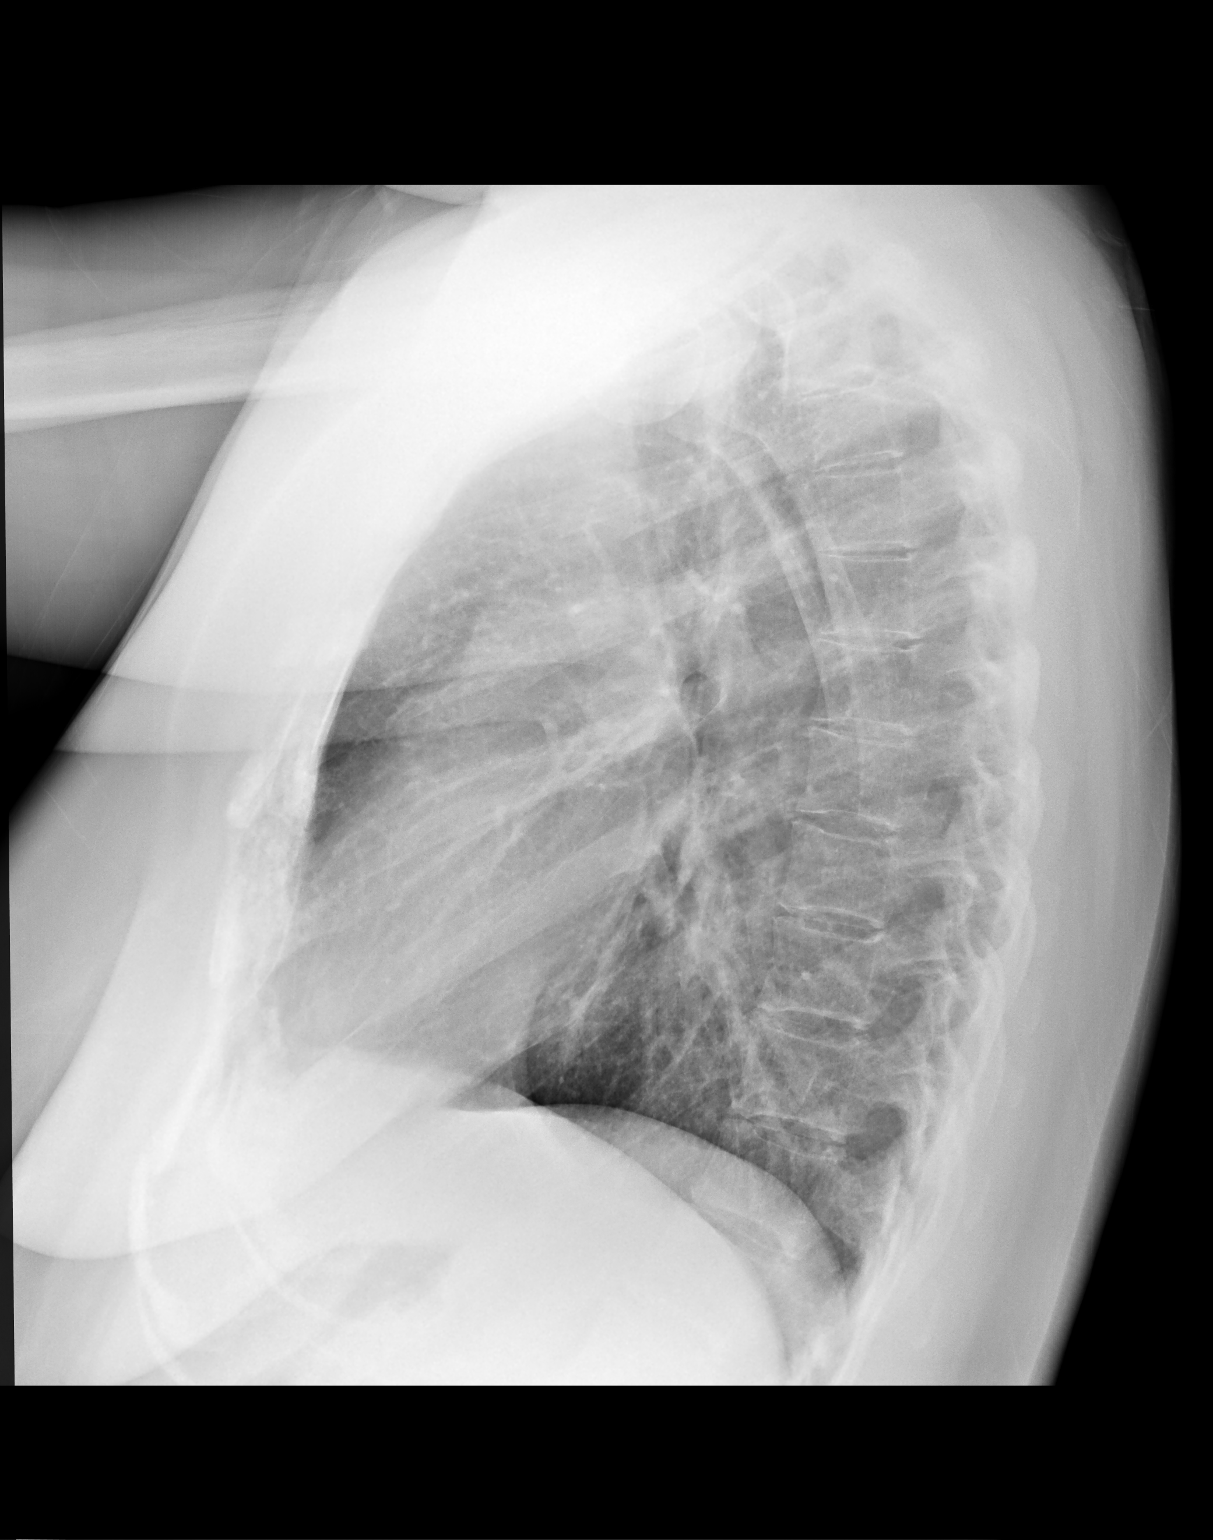

[2 of 2 positions shown; findings below may reference images not displayed]

FINDINGS: Cardiomediastinal silhouette is stable. No infiltrate or pleural
effusion. No pulmonary edema. Mild perihilar bronchitic changes.
IMPRESSION: No infiltrate or pulmonary edema. Mild perihilar bronchitic changes.

## 2018-01-22 ENCOUNTER — Ambulatory Visit: Payer: BLUE CROSS/BLUE SHIELD | Admitting: Nurse Practitioner

## 2018-01-22 ENCOUNTER — Encounter: Payer: Self-pay | Admitting: Nurse Practitioner

## 2018-01-22 VITALS — BP 136/94 | HR 75 | Resp 16 | Ht 62.0 in | Wt 168.2 lb

## 2018-01-22 DIAGNOSIS — R635 Abnormal weight gain: Secondary | ICD-10-CM | POA: Diagnosis not present

## 2018-01-22 DIAGNOSIS — R5383 Other fatigue: Secondary | ICD-10-CM

## 2018-01-22 DIAGNOSIS — F331 Major depressive disorder, recurrent, moderate: Secondary | ICD-10-CM | POA: Diagnosis not present

## 2018-01-22 MED ORDER — FLUOXETINE HCL 10 MG PO TABS: 10.0000 mg | ORAL_TABLET | Freq: Every day | ORAL | 1 refills | Status: DC

## 2018-01-22 NOTE — Progress Notes (Signed)
[facility] [place], [place] [address]  Internal MEDICINE  Office Visit Note  Patient Name: Teresa Horne  [id number]  [id number]  Date of Service: 02/01/2018  Chief Complaint  Patient presents with  . Depression    Depression         This is a recurrent problem.  The current episode started more than 1 month ago.   The onset quality is gradual.   The problem occurs constantly.  The problem has been gradually worsening since onset.  Associated symptoms include decreased concentration, fatigue, restlessness, decreased interest, appetite change and headaches.  Associated symptoms include no suicidal ideas.     The symptoms are aggravated by family issues.  Past treatments include nothing.  Compliance with treatment is good.  Past compliance problems include medication issues.  Previous treatment provided mild relief.  Risk factors include family history of mental illness, major life event and stress.   Past medical history includes chronic fatigue syndrome and anxiety.     Pt is here for routine follow up.    Current Medication: Outpatient Encounter Medications as of 01/22/2018  Medication Sig  . chlorpheniramine-HYDROcodone (TUSSIONEX PENNKINETIC ER) 10-8 MG/5ML SUER Take 5 mLs by mouth every 12 (twelve) hours as needed. (Patient not taking: Reported on 01/22/2018)  . doxycycline (VIBRA-TABS) 100 MG tablet Take 1 tablet (100 mg total) by mouth 2 (two) times daily. (Patient not taking: Reported on 01/22/2018)  . FLUoxetine (PROZAC) 10 MG tablet Take 1 tablet (10 mg total) by mouth daily.  Marland Kitchen levonorgestrel (MIRENA) 20 MCG/24HR IUD 1 each by Intrauterine route once. Has had since 2007    No facility-administered encounter medications on file as of 01/22/2018.     Surgical History: Past Surgical History:  Procedure Laterality Date  . CESAREAN SECTION    . CHOLECYSTECTOMY  10/04  . COLPOSCOPY  5/02   HPV--no dysplasia  . KNEE SURGERY  2002   MCL rupture     Medical History: Past Medical History:  Diagnosis Date  . Complete tear of MCL of knee 2002  . Depression   . Eating disorder, unspecified   . HPV in female   . Hyperlipidemia   . Hypothyroidism   . Mononucleosis 1996  . Other malaise and fatigue     Family History: Family History  Problem Relation Age of Onset  . Other Mother        ? Cluster headaches    Social History   Socioeconomic History  . Marital status: Married    Spouse name: Not on file  . Number of children: Not on file  . Years of education: Not on file  . Highest education level: Not on file  Social Needs  . Financial resource strain: Not on file  . Food insecurity - worry: Not on file  . Food insecurity - inability: Not on file  . Transportation needs - medical: Not on file  . Transportation needs - non-medical: Not on file  Occupational History  . Not on file  Tobacco Use  . Smoking status: Never Smoker  . Smokeless tobacco: Never Used  Substance and Sexual Activity  . Alcohol use: Yes    Comment: rare  . Drug use: No  . Sexual activity: Yes    Birth control/protection: IUD    Comment: mirena  Other Topics Concern  . Not on file  Social History Narrative   Married;1 child      Review of Systems  Constitutional: Positive for  activity change, appetite change and fatigue. Negative for chills and unexpected weight change.  HENT: Negative for congestion, postnasal drip, rhinorrhea, sneezing and sore throat.   Eyes: Negative.  Negative for redness.  Respiratory: Negative for cough, chest tightness and shortness of breath.   Cardiovascular: Negative for chest pain and palpitations.  Gastrointestinal: Negative for abdominal pain, constipation, diarrhea, nausea and vomiting.  Endocrine: Negative for cold intolerance, heat intolerance, polydipsia, polyphagia and polyuria.  Genitourinary: Negative for dysuria and frequency.  Musculoskeletal: Positive for arthralgias. Negative for back pain,  joint swelling and neck pain.  Skin: Negative for rash.  Allergic/Immunologic: Negative for environmental allergies.  Neurological: Positive for headaches. Negative for tremors, weakness and numbness.  Hematological: Negative for adenopathy. Does not bruise/bleed easily.  Psychiatric/Behavioral: Positive for behavioral problems (Depression), decreased concentration, depression and sleep disturbance. Negative for suicidal ideas. The patient is nervous/anxious.     Today's Vitals   01/22/18 1142  BP: (!) 136/94  Pulse: 75  Resp: 16  SpO2: 100%  Weight: 168 lb 3.2 oz (76.3 kg)  Height: 5\' 2"  (1.575 m)    Physical Exam  Constitutional: She is oriented to person, place, and time. She appears well-developed and well-nourished. No distress.  HENT:  Head: Normocephalic and atraumatic.  Mouth/Throat: Oropharynx is clear and moist. No oropharyngeal exudate.  Eyes: EOM are normal. Pupils are equal, round, and reactive to light.  Neck: Normal range of motion. Neck supple. No JVD present. No tracheal deviation present. No thyromegaly present.  Cardiovascular: Normal rate, regular rhythm and normal heart sounds. Exam reveals no gallop and no friction rub.  No murmur heard. Pulmonary/Chest: Effort normal and breath sounds normal. No respiratory distress. She has no wheezes. She has no rales. She exhibits no tenderness.  Abdominal: Soft. Bowel sounds are normal. There is no tenderness.  Musculoskeletal: Normal range of motion.  Lymphadenopathy:    She has no cervical adenopathy.  Neurological: She is alert and oriented to person, place, and time. No cranial nerve deficit.  Skin: Skin is warm and dry. She is not diaphoretic.  Psychiatric: Her speech is normal and behavior is normal. Judgment and thought content normal. Her mood appears anxious. Cognition and memory are normal. She exhibits a depressed mood.  Nursing note and vitals reviewed.   Assessment/Plan: 1. Other fatigue Check labs,  including anemia and thyroid panels.   2. Moderate recurrent major depression (HCC) - FLUoxetine (PROZAC) 10 MG tablet; Take 1 tablet (10 mg total) by mouth daily.  Dispense: 30 tablet; Refill: 1  3. Abnormal weight gain Check labs, including full thyroid panel.   General Counseling: Cassidy verbalizes understanding of the findings of todays visit and agrees with plan of treatment. I have discussed any further diagnostic evaluation that may be needed or ordered today. We also reviewed her medications today. she has been encouraged to call the office with any questions or concerns that should arise related to todays visit.   This patient was seen by Leretha Pol, FNP- C in Collaboration with Dr Lavera Guise as a part of collaborative care agreement    No orders of the defined types were placed in this encounter.   Meds ordered this encounter  Medications  . FLUoxetine (PROZAC) 10 MG tablet    Sig: Take 1 tablet (10 mg total) by mouth daily.    Dispense:  30 tablet    Refill:  1    Order Specific Question:   Supervising Provider    Answer:   Humphrey Rolls,  Timoteo Gaul [[id number]]    Time spent: 25 Minutes          Dr Lavera Guise Internal medicine

## 2018-01-29 ENCOUNTER — Other Ambulatory Visit: Payer: Self-pay | Admitting: Nurse Practitioner

## 2018-01-29 DIAGNOSIS — R635 Abnormal weight gain: Secondary | ICD-10-CM | POA: Diagnosis not present

## 2018-01-29 DIAGNOSIS — E559 Vitamin D deficiency, unspecified: Secondary | ICD-10-CM | POA: Diagnosis not present

## 2018-01-29 DIAGNOSIS — R5383 Other fatigue: Secondary | ICD-10-CM | POA: Diagnosis not present

## 2018-01-29 DIAGNOSIS — F331 Major depressive disorder, recurrent, moderate: Secondary | ICD-10-CM | POA: Diagnosis not present

## 2018-01-30 LAB — CBC
Hematocrit: 43.5 % (ref 34.0–46.6)
Hemoglobin: 14.3 g/dL (ref 11.1–15.9)
MCH: 31 pg (ref 26.6–33.0)
MCHC: 32.9 g/dL (ref 31.5–35.7)
MCV: 94 fL (ref 79–97)
PLATELETS: 421 10*3/uL — AB (ref 150–379)
RBC: 4.61 x10E6/uL (ref 3.77–5.28)
RDW: 12.9 % (ref 12.3–15.4)
WBC: 7.8 10*3/uL (ref 3.4–10.8)

## 2018-01-30 LAB — COMPREHENSIVE METABOLIC PANEL
A/G RATIO: 1.3 (ref 1.2–2.2)
ALBUMIN: 4.3 g/dL (ref 3.5–5.5)
ALK PHOS: 96 IU/L (ref 39–117)
ALT: 13 IU/L (ref 0–32)
AST: 15 IU/L (ref 0–40)
BILIRUBIN TOTAL: 0.4 mg/dL (ref 0.0–1.2)
BUN / CREAT RATIO: 10 (ref 9–23)
BUN: 7 mg/dL (ref 6–24)
CO2: 20 mmol/L (ref 20–29)
CREATININE: 0.68 mg/dL (ref 0.57–1.00)
Calcium: 9 mg/dL (ref 8.7–10.2)
Chloride: 104 mmol/L (ref 96–106)
GFR calc Af Amer: 125 mL/min/{1.73_m2} (ref >59)
GFR calc non Af Amer: 108 mL/min/{1.73_m2} (ref >59)
GLOBULIN, TOTAL: 3.4 g/dL (ref 1.5–4.5)
Glucose: 92 mg/dL (ref 65–99)
POTASSIUM: 4.7 mmol/L (ref 3.5–5.2)
SODIUM: 141 mmol/L (ref 134–144)
Total Protein: 7.7 g/dL (ref 6.0–8.5)

## 2018-01-30 LAB — LIPID PANEL W/O CHOL/HDL RATIO
CHOLESTEROL TOTAL: 220 mg/dL — AB (ref 100–199)
HDL: 35 mg/dL — AB (ref >39)
LDL Calculated: 160 mg/dL — ABNORMAL HIGH (ref 0–99)
Triglycerides: 124 mg/dL (ref 0–149)
VLDL CHOLESTEROL CAL: 25 mg/dL (ref 5–40)

## 2018-01-30 LAB — VITAMIN D 25 HYDROXY (VIT D DEFICIENCY, FRACTURES): VIT D 25 HYDROXY: 30.3 ng/mL (ref 30.0–100.0)

## 2018-01-30 LAB — T4, FREE: Free T4: 1.24 ng/dL (ref 0.82–1.77)

## 2018-01-30 LAB — TSH: TSH: 1.35 u[IU]/mL (ref 0.450–4.500)

## 2018-02-01 DIAGNOSIS — R635 Abnormal weight gain: Secondary | ICD-10-CM | POA: Insufficient documentation

## 2018-02-01 DIAGNOSIS — R5383 Other fatigue: Secondary | ICD-10-CM | POA: Insufficient documentation

## 2018-02-01 DIAGNOSIS — F331 Major depressive disorder, recurrent, moderate: Secondary | ICD-10-CM | POA: Insufficient documentation

## 2018-02-08 ENCOUNTER — Ambulatory Visit: Payer: BLUE CROSS/BLUE SHIELD | Admitting: Nurse Practitioner

## 2018-02-08 ENCOUNTER — Encounter: Payer: Self-pay | Admitting: Nurse Practitioner

## 2018-02-08 VITALS — BP 130/80 | HR 76 | Resp 16 | Ht 63.0 in | Wt 170.8 lb

## 2018-02-08 DIAGNOSIS — R5383 Other fatigue: Secondary | ICD-10-CM

## 2018-02-08 DIAGNOSIS — E782 Mixed hyperlipidemia: Secondary | ICD-10-CM

## 2018-02-08 DIAGNOSIS — F331 Major depressive disorder, recurrent, moderate: Secondary | ICD-10-CM

## 2018-02-08 NOTE — Progress Notes (Signed)
[facility] [place], [place] [address]  Internal MEDICINE  Office Visit Note  Patient Name: Teresa Horne  [id number]  [id number]  Date of Service: 02/17/2018  Chief Complaint  Patient presents with  . Depression    The patient was started on fluoxetine at her most recent visit. Today, she is reporting improvement in depression and anxiety. Feels like she has a better handle on irritability and tearfulness. She states that she is not reacting to irritation as quickly as before. She also states that her sleep has improved some.    Depression         This is a recurrent problem.  The current episode started more than 1 month ago.   The onset quality is gradual.   The problem occurs constantly.  The problem has been gradually improving since onset.  Associated symptoms include decreased concentration, fatigue, restlessness, decreased interest, appetite change and headaches.  Associated symptoms include no suicidal ideas.     The symptoms are aggravated by family issues.  Past treatments include SSRIs - Selective serotonin reuptake inhibitors.  Compliance with treatment is good.  Previous treatment provided moderate relief.  Risk factors include family history of mental illness, major life event and stress.   Past medical history includes chronic fatigue syndrome and anxiety.     Pt is here for routine follow up.    Current Medication: Outpatient Encounter Medications as of 02/08/2018  Medication Sig  . FLUoxetine (PROZAC) 10 MG tablet Take 1 tablet (10 mg total) by mouth daily.  Marland Kitchen levonorgestrel (MIRENA) 20 MCG/24HR IUD 1 each by Intrauterine route once. Has had since 2007   . chlorpheniramine-HYDROcodone (TUSSIONEX PENNKINETIC ER) 10-8 MG/5ML SUER Take 5 mLs by mouth every 12 (twelve) hours as needed. (Patient not taking: Reported on 01/22/2018)  . doxycycline (VIBRA-TABS) 100 MG tablet Take 1 tablet (100 mg total) by mouth 2 (two) times daily. (Patient not  taking: Reported on 01/22/2018)   No facility-administered encounter medications on file as of 02/08/2018.     Surgical History: Past Surgical History:  Procedure Laterality Date  . CESAREAN SECTION    . CHOLECYSTECTOMY  10/04  . COLPOSCOPY  5/02   HPV--no dysplasia  . KNEE SURGERY  2002   MCL rupture    Medical History: Past Medical History:  Diagnosis Date  . Complete tear of MCL of knee 2002  . Depression   . Eating disorder, unspecified   . HPV in female   . Hyperlipidemia   . Hypothyroidism   . Mononucleosis 1996  . Other malaise and fatigue     Family History: Family History  Problem Relation Age of Onset  . Other Mother        ? Cluster headaches    Social History   Socioeconomic History  . Marital status: Married    Spouse name: Not on file  . Number of children: Not on file  . Years of education: Not on file  . Highest education level: Not on file  Social Needs  . Financial resource strain: Not on file  . Food insecurity - worry: Not on file  . Food insecurity - inability: Not on file  . Transportation needs - medical: Not on file  . Transportation needs - non-medical: Not on file  Occupational History  . Not on file  Tobacco Use  . Smoking status: Never Smoker  . Smokeless tobacco: Never Used  Substance and Sexual Activity  . Alcohol use: Yes  Comment: rare  . Drug use: No  . Sexual activity: Yes    Birth control/protection: IUD    Comment: mirena  Other Topics Concern  . Not on file  Social History Narrative   Married;1 child      Review of Systems  Constitutional: Positive for activity change, appetite change and fatigue. Negative for chills and unexpected weight change.  HENT: Negative for congestion, postnasal drip, rhinorrhea, sneezing and sore throat.   Eyes: Negative.  Negative for redness.  Respiratory: Negative for cough, chest tightness and shortness of breath.   Cardiovascular: Negative for chest pain and palpitations.   Gastrointestinal: Negative for abdominal pain, constipation, diarrhea, nausea and vomiting.  Endocrine: Negative for cold intolerance, heat intolerance, polydipsia, polyphagia and polyuria.  Genitourinary: Negative for dysuria and frequency.  Musculoskeletal: Positive for arthralgias. Negative for back pain, joint swelling and neck pain.  Skin: Negative for rash.  Allergic/Immunologic: Negative for environmental allergies.  Neurological: Positive for headaches. Negative for tremors, weakness and numbness.  Hematological: Negative for adenopathy. Does not bruise/bleed easily.  Psychiatric/Behavioral: Positive for behavioral problems (Depression), decreased concentration, depression and sleep disturbance. Negative for suicidal ideas. The patient is nervous/anxious.        Started on prozac 10mg  at her last visit. Patient reporting improveemnt in symptoms.    Today's Vitals   02/08/18 1233  BP: 130/80  Pulse: 76  Resp: 16  SpO2: 97%  Weight: 170 lb 12.8 oz (77.5 kg)  Height: 5\' 3"  (1.6 m)     Physical Exam  Constitutional: She is oriented to person, place, and time. She appears well-developed and well-nourished. No distress.  HENT:  Head: Normocephalic and atraumatic.  Mouth/Throat: Oropharynx is clear and moist. No oropharyngeal exudate.  Eyes: EOM are normal. Pupils are equal, round, and reactive to light.  Neck: Normal range of motion. Neck supple. No JVD present. No tracheal deviation present. No thyromegaly present.  Cardiovascular: Normal rate, regular rhythm and normal heart sounds. Exam reveals no gallop and no friction rub.  No murmur heard. Pulmonary/Chest: Effort normal and breath sounds normal. No respiratory distress. She has no wheezes. She has no rales. She exhibits no tenderness.  Abdominal: Soft. Bowel sounds are normal. There is no tenderness.  Musculoskeletal: Normal range of motion.  Lymphadenopathy:    She has no cervical adenopathy.  Neurological: She is alert  and oriented to person, place, and time. No cranial nerve deficit.  Skin: Skin is warm and dry. She is not diaphoretic.  Psychiatric: Her speech is normal and behavior is normal. Judgment and thought content normal. Her mood appears anxious. Cognition and memory are normal. She exhibits a depressed mood.  The patient still a bit tearful during visit. Overall, much improved from most recent visit.   Nursing note and vitals reviewed.   Assessment/Plan:  1. Moderate recurrent major depression (HCC) Improved. Continue prozac 10mg  daily.   2. Mixed hyperlipidemia Reviewed labs showing mild, generalized elevation of lipid panel. Discussed diet and lifestyle changes to help lower cholesterol. Recheck in 1 year.   3. Other fatigue Improved. Discussed labs with normal thyroid panel. Likely related to depression. Will monitor. Recommend participation in routine physical activity to improve levels of fatigue.   General Counseling: Terica verbalizes understanding of the findings of todays visit and agrees with plan of treatment. I have discussed any further diagnostic evaluation that may be needed or ordered today. We also reviewed her medications today. she has been encouraged to call the office with any questions  or concerns that should arise related to todays visit.   This patient was seen by Leretha Pol, FNP- C in Collaboration with Dr Lavera Guise as a part of collaborative care agreement      Time spent: 35 Minutes   Dr Lavera Guise Internal medicine

## 2018-02-17 DIAGNOSIS — E785 Hyperlipidemia, unspecified: Secondary | ICD-10-CM | POA: Insufficient documentation

## 2018-03-11 DIAGNOSIS — H5213 Myopia, bilateral: Secondary | ICD-10-CM | POA: Diagnosis not present

## 2018-03-11 DIAGNOSIS — H52223 Regular astigmatism, bilateral: Secondary | ICD-10-CM | POA: Diagnosis not present

## 2018-03-23 ENCOUNTER — Ambulatory Visit: Payer: BLUE CROSS/BLUE SHIELD | Admitting: Family Medicine

## 2018-03-23 ENCOUNTER — Other Ambulatory Visit: Payer: Self-pay

## 2018-03-23 ENCOUNTER — Encounter: Payer: Self-pay | Admitting: Family Medicine

## 2018-03-23 DIAGNOSIS — H5789 Other specified disorders of eye and adnexa: Secondary | ICD-10-CM | POA: Diagnosis not present

## 2018-03-23 DIAGNOSIS — H18213 Corneal edema secondary to contact lens, bilateral: Secondary | ICD-10-CM | POA: Diagnosis not present

## 2018-03-23 DIAGNOSIS — H18823 Corneal disorder due to contact lens, bilateral: Secondary | ICD-10-CM | POA: Diagnosis not present

## 2018-03-23 NOTE — Patient Instructions (Addendum)
Call Eye MD ASAP for evaluation of eye pain.

## 2018-03-23 NOTE — Progress Notes (Signed)
   Subjective:    Patient ID: Teresa Horne, female    DOB: [birth year], 42 y.o.   MRN: [id number]  Conjunctivitis   The current episode started today ( started with new contacts 5 days ago on day 3 .. 2 days later havd red eye.). The onset was sudden. The problem has been rapidly worsening. The problem is moderate. Associated symptoms include photophobia, eye pain and eye redness. Pertinent negatives include no fever, no decreased vision, no double vision, no eye itching, no abdominal pain, no ear pain, no rhinorrhea, no sore throat, no swollen glands, no cough, no wheezing, no rash and no eye discharge.    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  Nml eye exam 3/21.. Got new  Contacts.  Review of Systems  Constitutional: Negative for fever.  HENT: Negative for ear pain, rhinorrhea and sore throat.   Eyes: Positive for photophobia, pain and redness. Negative for double vision, discharge and itching.  Respiratory: Negative for cough and wheezing.   Gastrointestinal: Negative for abdominal pain.  Skin: Negative for rash.       Objective:   Physical Exam  Constitutional: Vital signs are normal. She appears well-developed and well-nourished. She is cooperative.  Non-toxic appearance. She does not appear ill. No distress.  HENT:  Head: Normocephalic.  Right Ear: Hearing, tympanic membrane, external ear and ear canal normal. Tympanic membrane is not erythematous, not retracted and not bulging.  Left Ear: Hearing, tympanic membrane, external ear and ear canal normal. Tympanic membrane is not erythematous, not retracted and not bulging.  Nose: Mucosal edema and rhinorrhea present. Right sinus exhibits no maxillary sinus tenderness and no frontal sinus tenderness. Left sinus exhibits no maxillary sinus tenderness and no frontal sinus tenderness.  Mouth/Throat: Uvula is midline, oropharynx is clear and moist and mucous membranes are normal.  Eyes: Pupils are equal, round, and reactive to light. EOM  and lids are normal. Lids are everted and swept, no foreign bodies found. Right conjunctiva is injected. Left conjunctiva is injected.  Light sensitrivityTearing Pain with eye movements.    Neck: Trachea normal and normal range of motion. Neck supple. Carotid bruit is not present. No thyroid mass and no thyromegaly present.  Cardiovascular: Normal rate, regular rhythm, S1 normal, S2 normal, normal heart sounds, intact distal pulses and normal pulses. Exam reveals no gallop and no friction rub.  No murmur heard. Pulmonary/Chest: Effort normal and breath sounds normal. No tachypnea. No respiratory distress. She has no decreased breath sounds. She has no wheezes. She has no rhonchi. She has no rales.  Neurological: She is alert.  Skin: Skin is warm, dry and intact. No rash noted.  Psychiatric: Her speech is normal and behavior is normal. Judgment normal. Her mood appears not anxious. Cognition and memory are normal. She does not exhibit a depressed mood.          Assessment & Plan:

## 2018-03-23 NOTE — Assessment & Plan Note (Signed)
Painful, photosensitive... Recent new contact start.  No clear infection seen .Marland Kitchen Tearing but no discharge.   Recommended ASAP eye MD eval.. Pt made in office for 2 PM today [facility].

## 2018-03-29 ENCOUNTER — Other Ambulatory Visit: Payer: Self-pay

## 2018-03-29 MED ORDER — FLUOXETINE HCL 10 MG PO TABS: 10.0000 mg | ORAL_TABLET | Freq: Every day | ORAL | 3 refills | Status: DC

## 2018-05-13 ENCOUNTER — Ambulatory Visit: Payer: Self-pay | Admitting: Nurse Practitioner

## 2018-06-04 ENCOUNTER — Ambulatory Visit: Payer: BLUE CROSS/BLUE SHIELD | Admitting: Nurse Practitioner

## 2018-06-04 ENCOUNTER — Encounter: Payer: Self-pay | Admitting: Nurse Practitioner

## 2018-06-04 VITALS — BP 123/87 | HR 90 | Resp 16 | Ht 62.0 in | Wt 174.4 lb

## 2018-06-04 DIAGNOSIS — R5383 Other fatigue: Secondary | ICD-10-CM

## 2018-06-04 DIAGNOSIS — F331 Major depressive disorder, recurrent, moderate: Secondary | ICD-10-CM | POA: Diagnosis not present

## 2018-06-04 DIAGNOSIS — E668 Other obesity: Secondary | ICD-10-CM | POA: Diagnosis not present

## 2018-06-04 MED ORDER — PHENTERMINE HCL 37.5 MG PO TABS: 37.5000 mg | ORAL_TABLET | Freq: Every day | ORAL | 1 refills | Status: DC

## 2018-06-04 NOTE — Progress Notes (Signed)
[facility] [place], [place] [address]  Internal MEDICINE  Office Visit Note  Patient Name: Teresa Horne  [id number]  [id number]  Date of Service: 06/04/2018   Pt is here for routine follow up.   Chief Complaint  Patient presents with  . Obesity    4 pound weight gain since most recent visit.   . Depression    The patient was recently treated for depression with prozac10mg  daily. Had lost her grandmother 06/2017 and became very emotional. Was eating for comfort. Started her on prozac, and after about 5 weeks, she was feeling much better. Was exercising more, was eating better. Started having more good days then bad. At this point, she started weaning herself off of the prozac. She has been off of this for about 6 weeks. Feeling good. Main concern is consistent weight gain. Has gained 4 pounds since her last visit. Has been eating well and is intermittently exercising. She has been on phentermine in the past. She had good results without negative side effects. Would like to start again to jump start weight loss.       Current Medication: Outpatient Encounter Medications as of 06/04/2018  Medication Sig Note  . levonorgestrel (MIRENA) 20 MCG/24HR IUD 1 each by Intrauterine route once. Has had since 2007    . phentermine (ADIPEX-P) 37.5 MG tablet Take 1 tablet (37.5 mg total) by mouth daily before breakfast.   . [DISCONTINUED] FLUoxetine (PROZAC) 10 MG tablet Take 1 tablet (10 mg total) by mouth daily. 06/04/2018: on hold for now.    No facility-administered encounter medications on file as of 06/04/2018.     Surgical History: Past Surgical History:  Procedure Laterality Date  . CESAREAN SECTION    . CHOLECYSTECTOMY  10/04  . COLPOSCOPY  5/02   HPV--no dysplasia  . KNEE SURGERY  2002   MCL rupture    Medical History: Past Medical History:  Diagnosis Date  . Complete tear of MCL of knee 2002  . Depression   . Eating disorder, unspecified   .  HPV in female   . Hyperlipidemia   . Hypothyroidism   . Mononucleosis 1996  . Other malaise and fatigue     Family History: Family History  Problem Relation Age of Onset  . Other Mother        ? Cluster headaches    Social History   Socioeconomic History  . Marital status: Married    Spouse name: Not on file  . Number of children: Not on file  . Years of education: Not on file  . Highest education level: Not on file  Occupational History  . Not on file  Social Needs  . Financial resource strain: Not on file  . Food insecurity:    Worry: Not on file    Inability: Not on file  . Transportation needs:    Medical: Not on file    Non-medical: Not on file  Tobacco Use  . Smoking status: Never Smoker  . Smokeless tobacco: Never Used  Substance and Sexual Activity  . Alcohol use: Yes    Comment: rare  . Drug use: No  . Sexual activity: Yes    Birth control/protection: IUD    Comment: mirena  Lifestyle  . Physical activity:    Days per week: Not on file    Minutes per session: Not on file  . Stress: Not on file  Relationships  . Social connections:    Talks on  phone: Not on file    Gets together: Not on file    Attends religious service: Not on file    Active member of club or organization: Not on file    Attends meetings of clubs or organizations: Not on file    Relationship status: Not on file  . Intimate partner violence:    Fear of current or ex partner: Not on file    Emotionally abused: Not on file    Physically abused: Not on file    Forced sexual activity: Not on file  Other Topics Concern  . Not on file  Social History Narrative   Married;1 child      Review of Systems  Constitutional: Positive for unexpected weight change. Negative for activity change, appetite change, chills and fatigue.       Four pound weight gain since her last visit.   HENT: Negative for congestion, postnasal drip, rhinorrhea, sneezing and sore throat.   Eyes: Negative.   Negative for redness.  Respiratory: Negative for cough, chest tightness and shortness of breath.   Cardiovascular: Negative for chest pain and palpitations.  Gastrointestinal: Negative for abdominal pain, constipation, diarrhea, nausea and vomiting.  Endocrine: Negative for cold intolerance, heat intolerance, polydipsia, polyphagia and polyuria.  Genitourinary: Negative.  Negative for dysuria and frequency.  Musculoskeletal: Positive for arthralgias. Negative for back pain, joint swelling and neck pain.  Skin: Negative for rash.  Allergic/Immunologic: Negative for environmental allergies.  Neurological: Positive for headaches. Negative for tremors, weakness and numbness.  Hematological: Negative for adenopathy. Does not bruise/bleed easily.  Psychiatric/Behavioral: Positive for sleep disturbance. Negative for behavioral problems (Depression), decreased concentration and suicidal ideas. The patient is not nervous/anxious.     Today's Vitals   06/04/18 1350  BP: 123/87  Pulse: 90  Resp: 16  SpO2: 99%  Weight: 174 lb 6.4 oz (79.1 kg)  Height: 5\' 2"  (1.575 m)   Physical Exam  Constitutional: She is oriented to person, place, and time. She appears well-developed and well-nourished. No distress.  HENT:  Head: Normocephalic and atraumatic.  Mouth/Throat: Oropharynx is clear and moist. No oropharyngeal exudate.  Eyes: Pupils are equal, round, and reactive to light. EOM are normal.  Neck: Normal range of motion. Neck supple. No JVD present. No tracheal deviation present. No thyromegaly present.  Cardiovascular: Normal rate, regular rhythm and normal heart sounds. Exam reveals no gallop and no friction rub.  No murmur heard. Pulmonary/Chest: Effort normal and breath sounds normal. No respiratory distress. She has no wheezes. She has no rales. She exhibits no tenderness.  Abdominal: Soft. Bowel sounds are normal. There is no tenderness.  Musculoskeletal: Normal range of motion.   Lymphadenopathy:    She has no cervical adenopathy.  Neurological: She is alert and oriented to person, place, and time. No cranial nerve deficit.  Skin: Skin is warm and dry. She is not diaphoretic.  Psychiatric: Her speech is normal and behavior is normal. Judgment and thought content normal. Her mood appears not anxious. Cognition and memory are normal. She does not exhibit a depressed mood.  Patient in good spirits  Nursing note and vitals reviewed.  Assessment/Plan: 1. Other fatigue Improving.   2. Moderate obesity Add phentermine. Reviewed risk factors and side effects. Recommended 1200 calorie diet. Encouraged cardiovascular exercise three to four times per week for 20 to 30 minutes.  - phentermine (ADIPEX-P) 37.5 MG tablet; Take 1 tablet (37.5 mg total) by mouth daily before breakfast.  Dispense: 30 tablet; Refill: 1  3.  Moderate recurrent major depression (HCC) In remission. Will monitor.   General Counseling: Margrette verbalizes understanding of the findings of todays visit and agrees with plan of treatment. I have discussed any further diagnostic evaluation that may be needed or ordered today. We also reviewed her medications today. she has been encouraged to call the office with any questions or concerns that should arise related to todays visit.    Counseling:   There is a liability release in patients' chart. There has been a 10 minute discussion about the side effects including but not limited to elevated blood pressure, anxiety, lack of sleep and dry mouth. Pt understands and will like to start/continue on appetite suppressant at this time. There will be one month RX given at the time of visit with proper follow up. Nova diet plan with restricted calories is given to the pt. Pt understands and agrees with  plan of treatment  This patient was seen by Leretha Pol, FNP- C in Collaboration with Dr Lavera Guise as a part of collaborative care agreement  Meds ordered  this encounter  Medications  . phentermine (ADIPEX-P) 37.5 MG tablet    Sig: Take 1 tablet (37.5 mg total) by mouth daily before breakfast.    Dispense:  30 tablet    Refill:  1    Order Specific Question:   Supervising Provider    Answer:   Lavera Guise [[id number]]    Time spent: 77 Minutes      Dr Lavera Guise Internal medicine

## 2018-07-09 ENCOUNTER — Encounter: Payer: Self-pay | Admitting: Nurse Practitioner

## 2018-07-09 ENCOUNTER — Ambulatory Visit: Payer: BLUE CROSS/BLUE SHIELD | Admitting: Nurse Practitioner

## 2018-07-09 VITALS — BP 137/83 | HR 90 | Resp 16 | Ht 62.0 in | Wt 167.4 lb

## 2018-07-09 DIAGNOSIS — E668 Other obesity: Secondary | ICD-10-CM

## 2018-07-09 DIAGNOSIS — M545 Low back pain, unspecified: Secondary | ICD-10-CM

## 2018-07-09 MED ORDER — TIZANIDINE HCL 2 MG PO TABS
2.0000 mg | ORAL_TABLET | Freq: Every evening | ORAL | 0 refills | Status: DC | PRN
Start: 2018-07-09 — End: 2018-08-05

## 2018-07-09 MED ORDER — PHENTERMINE HCL 37.5 MG PO TABS: 37.5000 mg | ORAL_TABLET | Freq: Every day | ORAL | 1 refills | Status: DC

## 2018-07-09 NOTE — Progress Notes (Signed)
[facility] [place], [place] [address]  Internal MEDICINE  Office Visit Note  Patient Name: Teresa Horne  [id number]  [id number]  Date of Service: 07/09/2018  Chief Complaint  Patient presents with  . Fatigue    1 month follow up  . Back Pain    The patient has had 7 pound weight loss since her last visit. Restarted phentermine at her last visit. She takes 1/2 tablet most days. Has noted dry mouth but no other negative side effects.  Has some low back pain. Very stiff. Built a foyer at [employer]. Tore it down this past Wednesday. On thursday, she was sore stiff, she was unable to work. Today, slightly improved and was able to work. Very sore in the morning. Had to have her kids help her out of bed. Was better once she got moving.       Current Medication: Outpatient Encounter Medications as of 07/09/2018  Medication Sig  . levonorgestrel (MIRENA) 20 MCG/24HR IUD 1 each by Intrauterine route once. Has had since 2007   . phentermine (ADIPEX-P) 37.5 MG tablet Take 1 tablet (37.5 mg total) by mouth daily before breakfast.  . [DISCONTINUED] phentermine (ADIPEX-P) 37.5 MG tablet Take 1 tablet (37.5 mg total) by mouth daily before breakfast.  . tiZANidine (ZANAFLEX) 2 MG tablet Take 1 tablet (2 mg total) by mouth at bedtime as needed for muscle spasms.   No facility-administered encounter medications on file as of 07/09/2018.     Surgical History: Past Surgical History:  Procedure Laterality Date  . CESAREAN SECTION    . CHOLECYSTECTOMY  10/04  . COLPOSCOPY  5/02   HPV--no dysplasia  . KNEE SURGERY  2002   MCL rupture    Medical History: Past Medical History:  Diagnosis Date  . Complete tear of MCL of knee 2002  . Depression   . Eating disorder, unspecified   . HPV in female   . Hyperlipidemia   . Hypothyroidism   . Mononucleosis 1996  . Other malaise and fatigue     Family History: Family History  Problem Relation Age  of Onset  . Other Mother        ? Cluster headaches    Social History   Socioeconomic History  . Marital status: Married    Spouse name: Not on file  . Number of children: Not on file  . Years of education: Not on file  . Highest education level: Not on file  Occupational History  . Not on file  Social Needs  . Financial resource strain: Not on file  . Food insecurity:    Worry: Not on file    Inability: Not on file  . Transportation needs:    Medical: Not on file    Non-medical: Not on file  Tobacco Use  . Smoking status: Never Smoker  . Smokeless tobacco: Never Used  Substance and Sexual Activity  . Alcohol use: Yes    Comment: rare  . Drug use: No  . Sexual activity: Yes    Birth control/protection: IUD    Comment: mirena  Lifestyle  . Physical activity:    Days per week: Not on file    Minutes per session: Not on file  . Stress: Not on file  Relationships  . Social connections:    Talks on phone: Not on file    Gets together: Not on file    Attends religious service: Not on file  Active member of club or organization: Not on file    Attends meetings of clubs or organizations: Not on file    Relationship status: Not on file  . Intimate partner violence:    Fear of current or ex partner: Not on file    Emotionally abused: Not on file    Physically abused: Not on file    Forced sexual activity: Not on file  Other Topics Concern  . Not on file  Social History Narrative   Married;1 child      Review of Systems  Constitutional: Negative for activity change, appetite change, chills, fatigue and unexpected weight change.       Seven pound weight loss since her last visit.   HENT: Negative for congestion, postnasal drip, rhinorrhea, sneezing and sore throat.   Eyes: Negative.  Negative for redness.  Respiratory: Negative for cough, chest tightness and shortness of breath.   Cardiovascular: Negative for chest pain and palpitations.  Gastrointestinal:  Negative for abdominal pain, constipation, diarrhea, nausea and vomiting.  Endocrine: Negative for cold intolerance, heat intolerance, polydipsia, polyphagia and polyuria.  Genitourinary: Negative.  Negative for dysuria and frequency.  Musculoskeletal: Positive for arthralgias and back pain. Negative for joint swelling and neck pain.  Skin: Negative for rash.  Allergic/Immunologic: Negative for environmental allergies.  Neurological: Positive for headaches. Negative for tremors, weakness and numbness.  Hematological: Negative for adenopathy. Does not bruise/bleed easily.  Psychiatric/Behavioral: Positive for sleep disturbance. Negative for behavioral problems (Depression), decreased concentration and suicidal ideas. The patient is not nervous/anxious.     Vital Signs: BP 137/83 (BP Location: Right Arm, Patient Position: Sitting, Cuff Size: Normal)   Pulse 90   Resp 16   Ht 5\' 2"  (1.575 m)   Wt 167 lb 6.4 oz (75.9 kg)   SpO2 100%   BMI 30.62 kg/m    Physical Exam  Constitutional: She is oriented to person, place, and time. She appears well-developed and well-nourished. No distress.  HENT:  Head: Normocephalic and atraumatic.  Mouth/Throat: Oropharynx is clear and moist. No oropharyngeal exudate.  Eyes: Pupils are equal, round, and reactive to light. EOM are normal.  Neck: Normal range of motion. Neck supple. No JVD present. No tracheal deviation present. No thyromegaly present.  Cardiovascular: Normal rate, regular rhythm and normal heart sounds. Exam reveals no gallop and no friction rub.  No murmur heard. Pulmonary/Chest: Effort normal and breath sounds normal. No respiratory distress. She has no wheezes. She has no rales. She exhibits no tenderness.  Abdominal: Soft. Bowel sounds are normal. There is no tenderness.  Musculoskeletal: Normal range of motion.  Moderate low back pain. Grimacing present with changing positions and sitting for more than 5 minutes. No palpable  abnormalities or deformities today.   Lymphadenopathy:    She has no cervical adenopathy.  Neurological: She is alert and oriented to person, place, and time. No cranial nerve deficit.  Skin: Skin is warm and dry. She is not diaphoretic.  Psychiatric: Her speech is normal and behavior is normal. Judgment and thought content normal. Her mood appears not anxious. Cognition and memory are normal. She does not exhibit a depressed mood.  Patient in good spirits  Nursing note and vitals reviewed.  Assessment/Plan: 1. Acute midline low back pain without sciatica Add tizanidine 2mg  tabs. May take 1/2 to 1 tablet at night. Recommended use of low heating pad to relieve tight muscles. Continue OTC ibuprofen as needed and as indicated.  - tiZANidine (ZANAFLEX) 2 MG tablet; Take  1 tablet (2 mg total) by mouth at bedtime as needed for muscle spasms.  Dispense: 30 tablet; Refill: 0  2. Moderate obesity Improving. Continue phentermine 37.5mg  tablets. Limit calorie intake to 1200 calories per day and continue regular exercise.  - phentermine (ADIPEX-P) 37.5 MG tablet; Take 1 tablet (37.5 mg total) by mouth daily before breakfast.  Dispense: 30 tablet; Refill: 1  General Counseling: Gissel verbalizes understanding of the findings of todays visit and agrees with plan of treatment. I have discussed any further diagnostic evaluation that may be needed or ordered today. We also reviewed her medications today. she has been encouraged to call the office with any questions or concerns that should arise related to todays visit.   This patient was seen by [place] with Dr Lavera Guise as a part of collaborative care agreement  Meds ordered this encounter  Medications  . phentermine (ADIPEX-P) 37.5 MG tablet    Sig: Take 1 tablet (37.5 mg total) by mouth daily before breakfast.    Dispense:  30 tablet    Refill:  1    Order Specific Question:   Supervising Provider    Answer:   Lavera Guise [place]  . tiZANidine (ZANAFLEX) 2 MG tablet    Sig: Take 1 tablet (2 mg total) by mouth at bedtime as needed for muscle spasms.    Dispense:  30 tablet    Refill:  0    Order Specific Question:   Supervising Provider    Answer:   Lavera Guise [[id number]]    Time spent: 73 Minutes      Dr Lavera Guise Internal medicine

## 2018-08-05 ENCOUNTER — Other Ambulatory Visit: Payer: Self-pay

## 2018-08-05 DIAGNOSIS — M545 Low back pain, unspecified: Secondary | ICD-10-CM

## 2018-08-05 MED ORDER — TIZANIDINE HCL 2 MG PO TABS: 2.0000 mg | ORAL_TABLET | Freq: Every evening | ORAL | 0 refills | Status: DC | PRN

## 2018-08-20 ENCOUNTER — Ambulatory Visit: Payer: Self-pay | Admitting: Nurse Practitioner

## 2018-09-06 ENCOUNTER — Ambulatory Visit: Payer: BLUE CROSS/BLUE SHIELD | Admitting: Nurse Practitioner

## 2018-09-06 ENCOUNTER — Encounter: Payer: Self-pay | Admitting: Nurse Practitioner

## 2018-09-06 VITALS — BP 110/80 | HR 90 | Resp 16 | Ht 62.0 in | Wt 165.0 lb

## 2018-09-06 DIAGNOSIS — M25561 Pain in right knee: Secondary | ICD-10-CM

## 2018-09-06 DIAGNOSIS — D2321 Other benign neoplasm of skin of right ear and external auricular canal: Secondary | ICD-10-CM | POA: Diagnosis not present

## 2018-09-06 DIAGNOSIS — E668 Other obesity: Secondary | ICD-10-CM | POA: Diagnosis not present

## 2018-09-06 DIAGNOSIS — M25562 Pain in left knee: Secondary | ICD-10-CM | POA: Diagnosis not present

## 2018-09-06 DIAGNOSIS — G8929 Other chronic pain: Secondary | ICD-10-CM

## 2018-09-06 NOTE — Progress Notes (Signed)
[facility] [place], [place] [address]  Internal MEDICINE  Office Visit Note  Patient Name: Teresa Horne  [id number]  [id number]  Date of Service: 09/06/2018  Chief Complaint  Patient presents with  . Ear Problem    sore in her right ear. has been present for several months.   . Medical Management of Chronic Issues    Weight Management   . Knee Pain    bilateral.     The patient states that she has a "cyst" or lesion in the right ear. This has been present for a few months. Sometimes it gets sore and will drain some fluid. After it drains, it will scab over and reform. Will not resolve. Intermittently taking phentermine to help with weight management. Has not really been taking it for about a month. When she does take it, she generally takes 1/2 tablet only. Has lost 2 pounds since she was last seen. BP is well managed. She reports no negative side effects from taking this medication. She has recently started to train for a half-marathon. Is running up to two miles during this training.   Knee Pain   The incident occurred more than 1 week ago. Incident location: no recent incident. started trainnig for 1/2 marathon. There was no injury mechanism. The pain is present in the left knee and right knee. The quality of the pain is described as aching. The pain is at a severity of 2/10. The pain has been intermittent since onset. Pertinent negatives include no numbness. She reports no foreign bodies present. Exacerbated by: running. She has tried ice and rest for the symptoms. The treatment provided mild relief.        Current Medication: Outpatient Encounter Medications as of 09/06/2018  Medication Sig  . levonorgestrel (MIRENA) 20 MCG/24HR IUD 1 each by Intrauterine route once. Has had since 2007   . phentermine (ADIPEX-P) 37.5 MG tablet Take 1 tablet (37.5 mg total) by mouth daily before breakfast.  . tiZANidine (ZANAFLEX) 2 MG tablet Take 1 tablet (2 mg  total) by mouth at bedtime as needed for muscle spasms.   No facility-administered encounter medications on file as of 09/06/2018.     Surgical History: Past Surgical History:  Procedure Laterality Date  . CESAREAN SECTION    . CHOLECYSTECTOMY  10/04  . COLPOSCOPY  5/02   HPV--no dysplasia  . KNEE SURGERY  2002   MCL rupture    Medical History: Past Medical History:  Diagnosis Date  . Complete tear of MCL of knee 2002  . Depression   . Eating disorder, unspecified   . HPV in female   . Hyperlipidemia   . Hypothyroidism   . Mononucleosis 1996  . Other malaise and fatigue     Family History: Family History  Problem Relation Age of Onset  . Other Mother        ? Cluster headaches    Social History   Socioeconomic History  . Marital status: Married    Spouse name: Not on file  . Number of children: Not on file  . Years of education: Not on file  . Highest education level: Not on file  Occupational History  . Not on file  Social Needs  . Financial resource strain: Not on file  . Food insecurity:    Worry: Not on file    Inability: Not on file  . Transportation needs:    Medical: Not on file    Non-medical: Not  on file  Tobacco Use  . Smoking status: Never Smoker  . Smokeless tobacco: Never Used  Substance and Sexual Activity  . Alcohol use: Yes    Comment: rare  . Drug use: No  . Sexual activity: Yes    Birth control/protection: IUD    Comment: mirena  Lifestyle  . Physical activity:    Days per week: Not on file    Minutes per session: Not on file  . Stress: Not on file  Relationships  . Social connections:    Talks on phone: Not on file    Gets together: Not on file    Attends religious service: Not on file    Active member of club or organization: Not on file    Attends meetings of clubs or organizations: Not on file    Relationship status: Not on file  . Intimate partner violence:    Fear of current or ex partner: Not on file    Emotionally  abused: Not on file    Physically abused: Not on file    Forced sexual activity: Not on file  Other Topics Concern  . Not on file  Social History Narrative   Married;1 child      Review of Systems  Constitutional: Negative for activity change, appetite change, chills, fatigue and unexpected weight change.       Two pound weight loss since her last visit.   HENT: Positive for ear pain. Negative for congestion, postnasal drip, rhinorrhea, sneezing and sore throat.        Sore in the right ear that will not go away.   Eyes: Negative.  Negative for redness.  Respiratory: Negative for cough, chest tightness and shortness of breath.   Cardiovascular: Negative for chest pain and palpitations.  Gastrointestinal: Negative for abdominal pain, constipation, diarrhea, nausea and vomiting.  Endocrine: Negative for cold intolerance, heat intolerance, polydipsia, polyphagia and polyuria.  Genitourinary: Negative.  Negative for dysuria and frequency.  Musculoskeletal: Positive for arthralgias. Negative for back pain, joint swelling and neck pain.       Hears a lot of crunching noise in both knees. Get sore after running. Has had multiple surgeries on the left knee in the past.   Skin: Negative for rash.  Allergic/Immunologic: Negative for environmental allergies.  Neurological: Positive for headaches. Negative for dizziness, tremors, weakness and numbness.  Hematological: Negative for adenopathy. Does not bruise/bleed easily.  Psychiatric/Behavioral: Positive for sleep disturbance. Negative for behavioral problems (Depression), decreased concentration and suicidal ideas. The patient is not nervous/anxious.     Vital Signs: BP 110/80   Pulse 90   Resp 16   Ht 5\' 2"  (1.575 m)   Wt 165 lb (74.8 kg)   SpO2 98%   BMI 30.18 kg/m    Physical Exam  Constitutional: She is oriented to person, place, and time. She appears well-developed and well-nourished. No distress.  HENT:  Head: Normocephalic  and atraumatic.  Ears:  Mouth/Throat: Oropharynx is clear and moist. No oropharyngeal exudate.  Eyes: Pupils are equal, round, and reactive to light. EOM are normal.  Neck: Normal range of motion. Neck supple. No JVD present. No tracheal deviation present. No thyromegaly present.  Cardiovascular: Normal rate, regular rhythm and normal heart sounds. Exam reveals no gallop and no friction rub.  No murmur heard. Pulmonary/Chest: Effort normal and breath sounds normal. No respiratory distress. She has no wheezes. She has no rales. She exhibits no tenderness.  Abdominal: Soft. Bowel sounds are normal. There is  no tenderness.  Musculoskeletal: Normal range of motion.  Moderate to severe crepitus can be felt with flexion and extension of both knees. Currently no tenderness. No bony abnormalities can be felt at this time. ROM and strength of both knees is intact.   Lymphadenopathy:    She has no cervical adenopathy.  Neurological: She is alert and oriented to person, place, and time. No cranial nerve deficit.  Skin: Skin is warm and dry. She is not diaphoretic.  Psychiatric: Her speech is normal and behavior is normal. Judgment and thought content normal. Her mood appears not anxious. Cognition and memory are normal. She does not exhibit a depressed mood.  Patient in good spirits  Nursing note and vitals reviewed.  Assessment/Plan:  1. Dermoid cyst of ear, right Refer to ENT for further evaluation and treatment.  - Ambulatory referral to ENT  2. Chronic pain of both knees Advised her to use elastic knee sleeves to provide support to the knees when training. Encouraged her to ice them after running and to take ibuprofen or aleve to reduce pain and inflammation. Suggest physical therapy for the knees to prevent injury to the knees while training for half-marathon. Written prescription/order was given to the patient.   3. Moderate obesity May continue to take phentermine as prescribed. No new  prescription needed today. Continue with low calorie diet and advance training as tolerated.   General Counseling: Jahari verbalizes understanding of the findings of todays visit and agrees with plan of treatment. I have discussed any further diagnostic evaluation that may be needed or ordered today. We also reviewed her medications today. she has been encouraged to call the office with any questions or concerns that should arise related to todays visit.  Apply a compressive ACE bandage. Rest and elevate the affected painful area.  Apply cold compresses intermittently as needed.  As pain recedes, begin normal activities slowly as tolerated.  Call if symptoms persist.  This patient was seen by Leretha Pol FNP Collaboration with Dr Lavera Guise as a part of collaborative care agreement  Orders Placed This Encounter  Procedures  . Ambulatory referral to ENT      Time spent: 23 Minutes      Dr Lavera Guise Internal medicine

## 2018-09-14 ENCOUNTER — Other Ambulatory Visit: Payer: Self-pay | Admitting: Nurse Practitioner

## 2018-09-14 DIAGNOSIS — M545 Low back pain, unspecified: Secondary | ICD-10-CM

## 2018-09-14 MED ORDER — TIZANIDINE HCL 2 MG PO TABS: 2.0000 mg | ORAL_TABLET | Freq: Every evening | ORAL | 1 refills | Status: AC | PRN

## 2018-11-08 ENCOUNTER — Ambulatory Visit: Payer: Self-pay | Admitting: Nurse Practitioner

## 2018-11-10 ENCOUNTER — Ambulatory Visit: Payer: Self-pay | Admitting: Nurse Practitioner

## 2019-03-29 ENCOUNTER — Other Ambulatory Visit: Payer: Self-pay

## 2019-03-30 ENCOUNTER — Telehealth: Payer: Self-pay

## 2019-03-30 NOTE — Telephone Encounter (Signed)
Unable to reach patient she needs to be seen with a telephone encounter visit so we can do her refills. Beth

## 2019-03-31 ENCOUNTER — Ambulatory Visit: Payer: BLUE CROSS/BLUE SHIELD | Admitting: Nurse Practitioner

## 2019-03-31 ENCOUNTER — Other Ambulatory Visit: Payer: Self-pay

## 2019-03-31 ENCOUNTER — Encounter: Payer: Self-pay | Admitting: Nurse Practitioner

## 2019-03-31 VITALS — Ht 62.0 in | Wt 163.0 lb

## 2019-03-31 DIAGNOSIS — R5383 Other fatigue: Secondary | ICD-10-CM | POA: Diagnosis not present

## 2019-03-31 DIAGNOSIS — F331 Major depressive disorder, recurrent, moderate: Secondary | ICD-10-CM

## 2019-03-31 MED ORDER — FLUOXETINE HCL 10 MG PO CAPS: 10.0000 mg | ORAL_CAPSULE | Freq: Every day | ORAL | 3 refills | Status: AC

## 2019-03-31 NOTE — Progress Notes (Signed)
[facility] [place], [place] [address]  Internal MEDICINE  Telephone Visit  Patient Name: Teresa Horne  [id number]  [id number]  Date of Service: 04/03/2019  I connected with the patient at 3:25pm by webcam and verified the patients identity using two identifiers.   I discussed the limitations, risks, security and privacy concerns of performing an evaluation and management service by webcam and the availability of in person appointments. I also discussed with the patient that there may be a patient responsible charge related to the service.  The patient expressed understanding and agrees to proceed.    Chief Complaint  Patient presents with  . Medical Management of Chronic Issues    medication refills    The patient has been contacted via webcam for follow up visit due to concerns for spread of novel coronavirus. Today, she is c/o of increased anxiety since threat of coronavirus started overtaking [place]. Her husband has been furloughed at work, and may lose insurance if this continues through the end of April. She is worried all the time and finds herself very irritable toward her husband and her kids. Not sleeping well. She has had problems with anxiety in the past. Did small dose of fluoxetine which helped her get through the rough patch and she was able to wean herself off the medication without difficulty. She has not had any issues with anxiety and depression for a few years.       Current Medication: Outpatient Encounter Medications as of 03/31/2019  Medication Sig  . levonorgestrel (MIRENA) 20 MCG/24HR IUD 1 each by Intrauterine route once. Has had since 2007   . phentermine (ADIPEX-P) 37.5 MG tablet Take 1 tablet (37.5 mg total) by mouth daily before breakfast.  . tiZANidine (ZANAFLEX) 2 MG tablet Take 1 tablet (2 mg total) by mouth at bedtime as needed for muscle spasms.  Marland Kitchen FLUoxetine (PROZAC) 10 MG capsule Take 1 capsule (10 mg total) by  mouth daily.   No facility-administered encounter medications on file as of 03/31/2019.     Surgical History: Past Surgical History:  Procedure Laterality Date  . CESAREAN SECTION    . CHOLECYSTECTOMY  10/04  . COLPOSCOPY  5/02   HPV--no dysplasia  . KNEE SURGERY  2002   MCL rupture    Medical History: Past Medical History:  Diagnosis Date  . Complete tear of MCL of knee 2002  . Depression   . Eating disorder, unspecified   . HPV in female   . Hyperlipidemia   . Hypothyroidism   . Mononucleosis 1996  . Other malaise and fatigue     Family History: Family History  Problem Relation Age of Onset  . Other Mother        ? Cluster headaches    Social History   Socioeconomic History  . Marital status: Married    Spouse name: Not on file  . Number of children: Not on file  . Years of education: Not on file  . Highest education level: Not on file  Occupational History  . Not on file  Social Needs  . Financial resource strain: Not on file  . Food insecurity:    Worry: Not on file    Inability: Not on file  . Transportation needs:    Medical: Not on file    Non-medical: Not on file  Tobacco Use  . Smoking status: Never Smoker  . Smokeless tobacco: Never Used  Substance and Sexual Activity  . Alcohol  use: Yes    Comment: rare  . Drug use: No  . Sexual activity: Yes    Birth control/protection: I.U.D.    Comment: mirena  Lifestyle  . Physical activity:    Days per week: Not on file    Minutes per session: Not on file  . Stress: Not on file  Relationships  . Social connections:    Talks on phone: Not on file    Gets together: Not on file    Attends religious service: Not on file    Active member of club or organization: Not on file    Attends meetings of clubs or organizations: Not on file    Relationship status: Not on file  . Intimate partner violence:    Fear of current or ex partner: Not on file    Emotionally abused: Not on file    Physically  abused: Not on file    Forced sexual activity: Not on file  Other Topics Concern  . Not on file  Social History Narrative   Married;1 child      Review of Systems  Constitutional: Positive for fatigue. Negative for chills and unexpected weight change.  HENT: Negative for congestion, postnasal drip, rhinorrhea, sneezing and sore throat.   Respiratory: Negative for cough, chest tightness, shortness of breath and wheezing.   Cardiovascular: Negative for chest pain and palpitations.  Gastrointestinal: Negative for abdominal pain, constipation, diarrhea, nausea and vomiting.  Genitourinary: Negative for dysuria and frequency.  Musculoskeletal: Negative for arthralgias, back pain, joint swelling and neck pain.  Skin: Negative for rash.  Neurological: Positive for headaches. Negative for tremors and numbness.  Hematological: Negative for adenopathy. Does not bruise/bleed easily.  Psychiatric/Behavioral: Positive for dysphoric mood. Negative for behavioral problems (Depression), sleep disturbance and suicidal ideas. The patient is nervous/anxious.     Today's Vitals   03/31/19 1509  Weight: 163 lb (73.9 kg)  Height: 5\' 2"  (1.575 m)   Body mass index is 29.81 kg/m.  Observation/Objective:  The patient is alert and oriented. She does seem worried but is, otherwise, in no acute distress.   Assessment/Plan:  1. Other fatigue Likely related to increased worry and anxiety. Will monitor.   2. Moderate recurrent major depression (HCC) Start fluoxetine 10mg  daily. Discussed ways to help with stress management. Will reassess in 4 to 6 weeks. - FLUoxetine (PROZAC) 10 MG capsule; Take 1 capsule (10 mg total) by mouth daily.  Dispense: 90 capsule; Refill: 3   General Counseling: [name] verbalizes understanding of the findings of today's phone visit and agrees with plan of treatment. I have discussed any further diagnostic evaluation that may be needed or ordered today. We also reviewed her  medications today. she has been encouraged to call the office with any questions or concerns that should arise related to todays visit.  This patient was seen by [place] with Dr Lavera Guise as a part of collaborative care agreement  Meds ordered this encounter  Medications  . FLUoxetine (PROZAC) 10 MG capsule    Sig: Take 1 capsule (10 mg total) by mouth daily.    Dispense:  90 capsule    Refill:  3    Order Specific Question:   Supervising Provider    Answer:   Lavera Guise [[id number]]    Time spent: 29 Minutes    Dr Lavera Guise Internal medicine

## 2019-05-17 ENCOUNTER — Ambulatory Visit: Payer: BLUE CROSS/BLUE SHIELD | Admitting: Nurse Practitioner

## 2019-05-19 ENCOUNTER — Ambulatory Visit: Payer: BLUE CROSS/BLUE SHIELD | Admitting: Nurse Practitioner

## 2019-05-19 ENCOUNTER — Encounter: Payer: Self-pay | Admitting: Nurse Practitioner

## 2019-05-19 VITALS — Resp 16 | Ht 62.0 in | Wt 168.0 lb

## 2019-05-19 DIAGNOSIS — R5383 Other fatigue: Secondary | ICD-10-CM | POA: Diagnosis not present

## 2019-05-19 DIAGNOSIS — F331 Major depressive disorder, recurrent, moderate: Secondary | ICD-10-CM | POA: Diagnosis not present

## 2019-05-19 DIAGNOSIS — E668 Other obesity: Secondary | ICD-10-CM | POA: Diagnosis not present

## 2019-05-19 MED ORDER — PHENTERMINE HCL 37.5 MG PO TABS: 37.5000 mg | ORAL_TABLET | Freq: Every day | ORAL | 2 refills | Status: AC

## 2019-05-19 NOTE — Progress Notes (Signed)
[facility] [place], [place] [address]  Internal MEDICINE  Telephone Visit  Patient Name: Teresa Horne  [id number]  [id number]  Date of Service: 05/19/2019  I connected with the patient at 10:29am by telephone and verified the patients identity using two identifiers.   I discussed the limitations, risks, security and privacy concerns of performing an evaluation and management service by telephone and the availability of in person appointments. I also discussed with the patient that there may be a patient responsible charge related to the service.  The patient expressed understanding and agrees to proceed.    Chief Complaint  Patient presents with  . Telephone Assessment  . Telephone Screen  . Follow-up    added back on fluoxetine  . Weight Loss    discuss weight management.    The patient has been contacted via telephone for follow up visit due to concerns for spread of novel coronavirus. She is c/o of increased anxiety since threat of coronavirus started overtaking [place]. Her husband has been furloughed at work, and may lose insurance if this continues through the end of April. She is worried all the time and finds herself very irritable toward her husband and her kids. Not sleeping well. She has had problems with anxiety in the past. Did small dose of fluoxetine which helped her get through the rough patch and she was able to wean herself off the medication without difficulty. She has not had any issues with anxiety and depression for a few years. Was started on prozac 10mg  at her last visit. States that the emotional ups and downs have settled down. Her job has now been declared as essential, so she is back at work and able to get out of the home for several hours every day. Her husband was also noted that rehire of employees at his work may begin I June or July. She feels much more hopeful and positive than she did at her most recent visit. She is  concerned about recent weight gain while at home much more often. States that she hand her family are focusing much more time and effort in meal preperation and eating together as a family. She states that she is also doing a bit of emotional eating. Would like to start back on phentermine to help curb appetite and get started with weight loss.        Current Medication: Outpatient Encounter Medications as of 05/19/2019  Medication Sig  . FLUoxetine (PROZAC) 10 MG capsule Take 1 capsule (10 mg total) by mouth daily.  Marland Kitchen levonorgestrel (MIRENA) 20 MCG/24HR IUD 1 each by Intrauterine route once. Has had since 2007   . phentermine (ADIPEX-P) 37.5 MG tablet Take 1 tablet (37.5 mg total) by mouth daily before breakfast.  . tiZANidine (ZANAFLEX) 2 MG tablet Take 1 tablet (2 mg total) by mouth at bedtime as needed for muscle spasms.  . [DISCONTINUED] phentermine (ADIPEX-P) 37.5 MG tablet Take 1 tablet (37.5 mg total) by mouth daily before breakfast.   No facility-administered encounter medications on file as of 05/19/2019.     Surgical History: Past Surgical History:  Procedure Laterality Date  . CESAREAN SECTION    . CHOLECYSTECTOMY  10/04  . COLPOSCOPY  5/02   HPV--no dysplasia  . KNEE SURGERY  2002   MCL rupture    Medical History: Past Medical History:  Diagnosis Date  . Complete tear of MCL of knee 2002  . Depression   . Eating disorder,  unspecified   . HPV in female   . Hyperlipidemia   . Hypothyroidism   . Mononucleosis 1996  . Other malaise and fatigue     Family History: Family History  Problem Relation Age of Onset  . Other Mother        ? Cluster headaches    Social History   Socioeconomic History  . Marital status: Married    Spouse name: Not on file  . Number of children: Not on file  . Years of education: Not on file  . Highest education level: Not on file  Occupational History  . Not on file  Social Needs  . Financial resource strain: Not on file  .  Food insecurity:    Worry: Not on file    Inability: Not on file  . Transportation needs:    Medical: Not on file    Non-medical: Not on file  Tobacco Use  . Smoking status: Never Smoker  . Smokeless tobacco: Never Used  Substance and Sexual Activity  . Alcohol use: Yes    Comment: rare  . Drug use: No  . Sexual activity: Yes    Birth control/protection: I.U.D.    Comment: mirena  Lifestyle  . Physical activity:    Days per week: Not on file    Minutes per session: Not on file  . Stress: Not on file  Relationships  . Social connections:    Talks on phone: Not on file    Gets together: Not on file    Attends religious service: Not on file    Active member of club or organization: Not on file    Attends meetings of clubs or organizations: Not on file    Relationship status: Not on file  . Intimate partner violence:    Fear of current or ex partner: Not on file    Emotionally abused: Not on file    Physically abused: Not on file    Forced sexual activity: Not on file  Other Topics Concern  . Not on file  Social History Narrative   Married;1 child      Review of Systems  Constitutional: Positive for fatigue and unexpected weight change. Negative for chills.  HENT: Negative for congestion, postnasal drip, rhinorrhea, sneezing and sore throat.   Respiratory: Negative for cough, chest tightness, shortness of breath and wheezing.   Cardiovascular: Negative for chest pain and palpitations.  Gastrointestinal: Negative for abdominal pain, constipation, diarrhea, nausea and vomiting.  Genitourinary: Negative for dysuria and frequency.  Musculoskeletal: Negative for arthralgias, back pain, joint swelling and neck pain.  Skin: Negative for rash.  Neurological: Positive for headaches. Negative for tremors and numbness.  Hematological: Negative for adenopathy. Does not bruise/bleed easily.  Psychiatric/Behavioral: Positive for dysphoric mood. Negative for behavioral problems  (Depression), sleep disturbance and suicidal ideas. The patient is nervous/anxious.        Improved since her most recent visit.     Today's Vitals   05/19/19 1004  Resp: 16  Weight: 168 lb (76.2 kg)  Height: 5\' 2"  (1.575 m)   Body mass index is 30.73 kg/m.  Observation/Objective:   The patient is alert and oriented. She is pleasant and answers all questions appropriately. Breathing is non-labored. She is in no acute distress at this time.    Assessment/Plan: 1. Other fatigue Improved with improved treatment of depression. Will continue to monitor   2. Moderate recurrent major depression (HCC) Improved. Continue prozac as prescribed   3. Moderate obesity  Restart phentermine daily. Limit calorie count to 1200 calories per day and incorporate exercise into daily routine.  - phentermine (ADIPEX-P) 37.5 MG tablet; Take 1 tablet (37.5 mg total) by mouth daily before breakfast.  Dispense: 30 tablet; Refill: 2  General Counseling: Jolyn verbalizes understanding of the findings of today's phone visit and agrees with plan of treatment. I have discussed any further diagnostic evaluation that may be needed or ordered today. We also reviewed her medications today. she has been encouraged to call the office with any questions or concerns that should arise related to todays visit.   There is a liability release in patients' chart. There has been a 10 minute discussion about the side effects including but not limited to elevated blood pressure, anxiety, lack of sleep and dry mouth. Pt understands and will like to start/continue on appetite suppressant at this time. There will be one month RX given at the time of visit with proper follow up. Nova diet plan with restricted calories is given to the pt. Pt understands and agrees with  plan of treatment  This patient was seen by Leretha Pol FNP Collaboration with Dr Lavera Guise as a part of collaborative care agreement  Meds ordered this  encounter  Medications  . phentermine (ADIPEX-P) 37.5 MG tablet    Sig: Take 1 tablet (37.5 mg total) by mouth daily before breakfast.    Dispense:  30 tablet    Refill:  2    Order Specific Question:   Supervising Provider    Answer:   Lavera Guise [[id number]]    Time spent: 32 Minutes    Dr Lavera Guise Internal medicine

## 2021-12-27 ENCOUNTER — Telehealth: Payer: Self-pay | Admitting: Internal Medicine

## 2021-12-27 NOTE — Telephone Encounter (Signed)
Pt called stating that she use to be a pt of Dr Glori Bickers and was asking if Dr Glori Bickers would take her back as a pt. Pt states that all of her family are pt of [facility]. Please advise.

## 2021-12-27 NOTE — Telephone Encounter (Signed)
That is fine, I prefer to start when we are back at [facility]

## 2022-01-28 ENCOUNTER — Other Ambulatory Visit: Payer: Self-pay

## 2022-01-28 ENCOUNTER — Ambulatory Visit
Admission: RE | Admit: 2022-01-28 | Discharge: 2022-01-28 | Disposition: A | Discharge status: Home / Self Care | Payer: BC Managed Care – PPO | Source: Ambulatory Visit | Attending: Emergency Medicine | Admitting: Emergency Medicine

## 2022-01-28 VITALS — BP 146/89 | HR 100 | Temp 98.1°F | Resp 18

## 2022-01-28 DIAGNOSIS — J020 Streptococcal pharyngitis: Secondary | ICD-10-CM | POA: Diagnosis not present

## 2022-01-28 LAB — POCT RAPID STREP A (OFFICE): Rapid Strep A Screen: POSITIVE — AB

## 2022-01-28 MED ORDER — AMOXICILLIN 875 MG PO TABS: 875.0000 mg | ORAL_TABLET | Freq: Two times a day (BID) | ORAL | 0 refills | Status: AC

## 2022-01-28 NOTE — Discharge Instructions (Addendum)
You have strep throat.  Take the amoxicillin as directed.  Follow up with your primary care provider if your symptoms are not improving.

## 2022-01-28 NOTE — ED Triage Notes (Signed)
Pt reports waking up overnight with significant R neck/jaw pain and throbbing throat.  No fever.  Pt states she did have a viral illness that lasted about a month, ending approx 10 days ago.  Feels like razorblades in throat when swallowing.

## 2022-01-28 NOTE — ED Provider Notes (Signed)
Teresa Horne    CSN: [id number] Arrival date & time: 01/28/22  1321      History   Chief Complaint Chief Complaint  Patient presents with   Sore Throat    HPI Teresa Horne is a 46 y.o. female. Patient presents with sore throat since last night.  She also reports 1 month history of ongoing cough which is improving.  She denies fever, chills, rash, shortness of breath, vomiting, diarrhea, or other symptoms.  Treatment with DayQuil and NyQuil.    The history is provided by the patient.   Past Medical History:  Diagnosis Date   Complete tear of MCL of knee 2002   Depression    Eating disorder, unspecified    HPV in female    Hyperlipidemia    Hypothyroidism    Mononucleosis 1996   Other malaise and fatigue     Patient Active Problem List   Diagnosis Date Noted   Dermoid cyst of ear, right 09/06/2018   Acute midline low back pain without sciatica 07/09/2018   Moderate obesity 06/04/2018   Red eyes 03/23/2018   Hyperlipidemia 02/17/2018   Other fatigue 02/01/2018   Moderate recurrent major depression ([place]) 02/01/2018   Abnormal weight gain 02/01/2018   Respiratory infection 11/19/2016   TMJ (temporomandibular joint disorder) 01/10/2015   Routine general medical examination at a health care facility 09/30/2011   Chronic pain of both knees 07/16/2007   HYPOTHYROIDISM 07/14/2007   EATING DISORDER 07/14/2007   DEPRESSION 07/14/2007    Past Surgical History:  Procedure Laterality Date   CESAREAN SECTION     CHOLECYSTECTOMY  10/04   COLPOSCOPY  5/02   HPV--no dysplasia   KNEE SURGERY  2002   MCL rupture    OB History     Gravida  2   Para      Term      Preterm      AB      Living  2      SAB      IAB      Ectopic      Multiple      Live Births  2            Home Medications    Prior to Admission medications   Medication Sig Start Date End Date Taking? Authorizing Provider  amoxicillin (AMOXIL) 875 MG tablet Take 1  tablet (875 mg total) by mouth 2 (two) times daily for 10 days. 01/28/22 02/07/22 Yes Sharion Balloon, NP  levonorgestrel (MIRENA) 20 MCG/24HR IUD 1 each by Intrauterine route once. Has had since 2007    Yes [provider]  FLUoxetine (PROZAC) 10 MG capsule Take 1 capsule (10 mg total) by mouth daily. 03/31/19   Ronnell Freshwater, NP  phentermine (ADIPEX-P) 37.5 MG tablet Take 1 tablet (37.5 mg total) by mouth daily before breakfast. 05/19/19   Ronnell Freshwater, NP  tiZANidine (ZANAFLEX) 2 MG tablet Take 1 tablet (2 mg total) by mouth at bedtime as needed for muscle spasms. 09/14/18   Ronnell Freshwater, NP    Family History Family History  Problem Relation Age of Onset   Other Mother        ? Cluster headaches    Social History Social History   Tobacco Use   Smoking status: Never   Smokeless tobacco: Never  Substance Use Topics   Alcohol use: Yes    Comment: rare   Drug use: No  Allergies   Patient has no known allergies.   Review of Systems Review of Systems  Constitutional:  Negative for chills and fever.  HENT:  Positive for sore throat. Negative for ear pain.   Respiratory:  Positive for cough. Negative for shortness of breath.   Cardiovascular:  Negative for chest pain and palpitations.  Gastrointestinal:  Negative for diarrhea and vomiting.  Skin:  Negative for color change and rash.  All other systems reviewed and are negative.   Physical Exam Triage Vital Signs ED Triage Vitals  Enc Vitals Group     BP 01/28/22 1335 (!) 146/89     Pulse Rate 01/28/22 1335 100     Resp 01/28/22 1335 18     Temp 01/28/22 1335 98.1 F (36.7 C)     Temp src --      SpO2 01/28/22 1335 98 %     Weight --      Height --      Head Circumference --      Peak Flow --      Pain Score 01/28/22 1338 7     Pain Loc --      Pain Edu? --      Excl. in [place]? --    No data found.  Updated Vital Signs BP (!) 146/89    Pulse 100    Temp 98.1 F (36.7 C)    Resp 18    SpO2 98%    Visual Acuity Right Eye Distance:   Left Eye Distance:   Bilateral Distance:    Right Eye Near:   Left Eye Near:    Bilateral Near:     Physical Exam Vitals and nursing note reviewed.  Constitutional:      General: She is not in acute distress.    Appearance: She is well-developed.  HENT:     Right Ear: Tympanic membrane normal.     Left Ear: Tympanic membrane normal.     Nose: Nose normal.     Mouth/Throat:     Mouth: Mucous membranes are moist.     Pharynx: Posterior oropharyngeal erythema present.  Cardiovascular:     Rate and Rhythm: Normal rate and regular rhythm.     Heart sounds: Normal heart sounds.  Pulmonary:     Effort: Pulmonary effort is normal. No respiratory distress.     Breath sounds: Normal breath sounds.  Musculoskeletal:     Cervical back: Neck supple.  Skin:    General: Skin is warm and dry.  Neurological:     Mental Status: She is alert.  Psychiatric:        Mood and Affect: Mood normal.        Behavior: Behavior normal.     UC Treatments / Results  Labs (all labs ordered are listed, but only abnormal results are displayed) Labs Reviewed  POCT RAPID STREP A (OFFICE) - Abnormal; Notable for the following components:      Result Value   Rapid Strep A Screen Positive (*)    All other components within normal limits    EKG   Radiology No results found.  Procedures Procedures (including critical care time)  Medications Ordered in UC Medications - No data to display  Initial Impression / Assessment and Plan / UC Course  I have reviewed the triage vital signs and the nursing notes.  Pertinent labs & imaging results that were available during my care of the patient were reviewed by me and considered in my medical  decision making (see chart for details).    Strep pharyngitis.  Rapid strep positive.  Treating with amoxicillin. Discussed symptomatic care, including tylenol or ibuprofen.  Instructed patient to follow up with her PCP if  her symptoms are not improving.  She agrees to plan of care.    Final Clinical Impressions(s) / UC Diagnoses   Final diagnoses:  Strep pharyngitis     Discharge Instructions      You have strep throat.  Take the amoxicillin as directed.  Follow up with your primary care provider if your symptoms are not improving.         ED Prescriptions     Medication Sig Dispense Auth. Provider   amoxicillin (AMOXIL) 875 MG tablet Take 1 tablet (875 mg total) by mouth 2 (two) times daily for 10 days. 20 tablet Sharion Balloon, NP      PDMP not reviewed this encounter.   Sharion Balloon, NP 01/28/22 [phone]
